# Patient Record
Sex: Female | Born: 1935 | Race: White | Hispanic: No | Smoking: Never smoker
Health system: Southern US, Community
[De-identification: ages and names within clinical notes are randomized; demographics above are authoritative.]

## PROBLEM LIST (undated history)

## (undated) DIAGNOSIS — K219 Gastro-esophageal reflux disease without esophagitis: Secondary | ICD-10-CM

## (undated) DIAGNOSIS — R002 Palpitations: Secondary | ICD-10-CM

## (undated) DIAGNOSIS — I499 Cardiac arrhythmia, unspecified: Secondary | ICD-10-CM

## (undated) DIAGNOSIS — I779 Disorder of arteries and arterioles, unspecified: Secondary | ICD-10-CM

## (undated) DIAGNOSIS — I2510 Atherosclerotic heart disease of native coronary artery without angina pectoris: Secondary | ICD-10-CM

## (undated) DIAGNOSIS — I10 Essential (primary) hypertension: Secondary | ICD-10-CM

## (undated) DIAGNOSIS — Z974 Presence of external hearing-aid: Secondary | ICD-10-CM

## (undated) DIAGNOSIS — M1990 Unspecified osteoarthritis, unspecified site: Secondary | ICD-10-CM

## (undated) DIAGNOSIS — H9190 Unspecified hearing loss, unspecified ear: Secondary | ICD-10-CM

## (undated) DIAGNOSIS — I739 Peripheral vascular disease, unspecified: Secondary | ICD-10-CM

## (undated) DIAGNOSIS — I341 Nonrheumatic mitral (valve) prolapse: Secondary | ICD-10-CM

## (undated) DIAGNOSIS — I219 Acute myocardial infarction, unspecified: Secondary | ICD-10-CM

## (undated) HISTORY — PX: DILATION AND CURETTAGE OF UTERUS: SHX78

## (undated) HISTORY — PX: CORONARY ANGIOPLASTY: SHX604

## (undated) HISTORY — PX: BREAST SURGERY: SHX581

---

## 2003-02-22 ENCOUNTER — Other Ambulatory Visit: Payer: Self-pay

## 2003-12-10 ENCOUNTER — Ambulatory Visit: Payer: Self-pay | Admitting: Unknown Physician Specialty

## 2003-12-27 ENCOUNTER — Emergency Department: Payer: Self-pay | Admitting: Emergency Medicine

## 2004-01-03 ENCOUNTER — Emergency Department: Payer: Self-pay | Admitting: Emergency Medicine

## 2004-12-12 ENCOUNTER — Ambulatory Visit: Payer: Self-pay | Admitting: Unknown Physician Specialty

## 2005-01-03 ENCOUNTER — Ambulatory Visit: Payer: Self-pay | Admitting: Gastroenterology

## 2005-11-23 ENCOUNTER — Ambulatory Visit: Payer: Self-pay | Admitting: Obstetrics and Gynecology

## 2005-12-01 ENCOUNTER — Ambulatory Visit: Payer: Self-pay | Admitting: Obstetrics and Gynecology

## 2005-12-13 ENCOUNTER — Ambulatory Visit: Payer: Self-pay | Admitting: Unknown Physician Specialty

## 2006-12-18 ENCOUNTER — Ambulatory Visit: Payer: Self-pay | Admitting: Unknown Physician Specialty

## 2007-12-20 ENCOUNTER — Ambulatory Visit: Payer: Self-pay | Admitting: Unknown Physician Specialty

## 2008-12-28 ENCOUNTER — Ambulatory Visit: Payer: Self-pay | Admitting: Unknown Physician Specialty

## 2009-06-13 ENCOUNTER — Inpatient Hospital Stay: Payer: Self-pay | Admitting: Internal Medicine

## 2009-06-14 ENCOUNTER — Ambulatory Visit: Payer: Self-pay | Admitting: Cardiovascular Disease

## 2009-06-21 ENCOUNTER — Ambulatory Visit: Payer: Self-pay | Admitting: Cardiovascular Disease

## 2009-12-30 ENCOUNTER — Ambulatory Visit: Payer: Self-pay | Admitting: Unknown Physician Specialty

## 2010-12-29 ENCOUNTER — Observation Stay: Payer: Self-pay | Admitting: Internal Medicine

## 2011-01-03 ENCOUNTER — Ambulatory Visit: Payer: Self-pay | Admitting: Unknown Physician Specialty

## 2011-04-03 ENCOUNTER — Emergency Department: Payer: Self-pay | Admitting: Emergency Medicine

## 2011-04-03 LAB — COMPREHENSIVE METABOLIC PANEL
Albumin: 3.8 g/dL (ref 3.4–5.0)
Anion Gap: 11 (ref 7–16)
BUN: 19 mg/dL — ABNORMAL HIGH (ref 7–18)
Bilirubin,Total: 0.6 mg/dL (ref 0.2–1.0)
Calcium, Total: 9.3 mg/dL (ref 8.5–10.1)
Chloride: 109 mmol/L — ABNORMAL HIGH (ref 98–107)
Co2: 24 mmol/L (ref 21–32)
Creatinine: 0.82 mg/dL (ref 0.60–1.30)
EGFR (African American): >60
EGFR (Non-African Amer.): >60
Osmolality: 289 (ref 275–301)
Potassium: 4 mmol/L (ref 3.5–5.1)
SGOT(AST): 32 U/L (ref 15–37)
Sodium: 144 mmol/L (ref 136–145)

## 2011-04-03 LAB — CBC WITH DIFFERENTIAL/PLATELET
Basophil #: 0.1 10*3/uL (ref 0.0–0.1)
HCT: 42.5 % (ref 35.0–47.0)
Lymphocyte #: 2 10*3/uL (ref 1.0–3.6)
Lymphocyte %: 23.4 %
MCHC: 31.8 g/dL — ABNORMAL LOW (ref 32.0–36.0)
Monocyte #: 0.9 10*3/uL — ABNORMAL HIGH (ref 0.0–0.7)
Monocyte %: 10.8 %
Neutrophil %: 63 %
Platelet: 180 10*3/uL (ref 150–440)
RDW: 13.3 % (ref 11.5–14.5)
WBC: 8.7 10*3/uL (ref 3.6–11.0)

## 2011-04-03 LAB — TROPONIN I: Troponin-I: <0.02 ng/mL

## 2011-04-03 LAB — PROTIME-INR: Prothrombin Time: 22 secs — ABNORMAL HIGH (ref 11.5–14.7)

## 2012-01-04 ENCOUNTER — Ambulatory Visit: Payer: Self-pay | Admitting: Internal Medicine

## 2013-01-07 ENCOUNTER — Ambulatory Visit: Payer: Self-pay | Admitting: Internal Medicine

## 2013-09-22 DIAGNOSIS — Z955 Presence of coronary angioplasty implant and graft: Secondary | ICD-10-CM

## 2015-07-06 DIAGNOSIS — I482 Chronic atrial fibrillation, unspecified: Secondary | ICD-10-CM | POA: Diagnosis present

## 2015-07-06 DIAGNOSIS — I10 Essential (primary) hypertension: Secondary | ICD-10-CM | POA: Diagnosis present

## 2016-01-12 ENCOUNTER — Encounter: Payer: Self-pay | Admitting: *Deleted

## 2016-01-18 ENCOUNTER — Encounter: Payer: Self-pay | Admitting: *Deleted

## 2016-01-18 ENCOUNTER — Ambulatory Visit: Payer: Medicare Other | Admitting: Anesthesiology

## 2016-01-18 ENCOUNTER — Encounter
Admission: RE | Disposition: A | Discharge status: Home / Self Care | Payer: Self-pay | Source: Ambulatory Visit | Attending: Ophthalmology

## 2016-01-18 ENCOUNTER — Ambulatory Visit
Admission: RE | Admit: 2016-01-18 | Discharge: 2016-01-18 | Disposition: A | Discharge status: Home / Self Care | Payer: Medicare Other | Source: Ambulatory Visit | Attending: Ophthalmology | Admitting: Ophthalmology

## 2016-01-18 DIAGNOSIS — I252 Old myocardial infarction: Secondary | ICD-10-CM | POA: Diagnosis not present

## 2016-01-18 DIAGNOSIS — Z791 Long term (current) use of non-steroidal anti-inflammatories (NSAID): Secondary | ICD-10-CM | POA: Insufficient documentation

## 2016-01-18 DIAGNOSIS — Z7901 Long term (current) use of anticoagulants: Secondary | ICD-10-CM | POA: Diagnosis not present

## 2016-01-18 DIAGNOSIS — H2512 Age-related nuclear cataract, left eye: Secondary | ICD-10-CM | POA: Diagnosis not present

## 2016-01-18 DIAGNOSIS — I2510 Atherosclerotic heart disease of native coronary artery without angina pectoris: Secondary | ICD-10-CM | POA: Diagnosis not present

## 2016-01-18 DIAGNOSIS — Z7982 Long term (current) use of aspirin: Secondary | ICD-10-CM | POA: Insufficient documentation

## 2016-01-18 DIAGNOSIS — I4891 Unspecified atrial fibrillation: Secondary | ICD-10-CM | POA: Diagnosis not present

## 2016-01-18 DIAGNOSIS — Z79899 Other long term (current) drug therapy: Secondary | ICD-10-CM | POA: Diagnosis not present

## 2016-01-18 DIAGNOSIS — I10 Essential (primary) hypertension: Secondary | ICD-10-CM | POA: Insufficient documentation

## 2016-01-18 DIAGNOSIS — Z955 Presence of coronary angioplasty implant and graft: Secondary | ICD-10-CM | POA: Diagnosis not present

## 2016-01-18 HISTORY — DX: Palpitations: R00.2

## 2016-01-18 HISTORY — DX: Cardiac arrhythmia, unspecified: I49.9

## 2016-01-18 HISTORY — DX: Peripheral vascular disease, unspecified: I73.9

## 2016-01-18 HISTORY — DX: Nonrheumatic mitral (valve) prolapse: I34.1

## 2016-01-18 HISTORY — DX: Atherosclerotic heart disease of native coronary artery without angina pectoris: I25.10

## 2016-01-18 HISTORY — DX: Essential (primary) hypertension: I10

## 2016-01-18 HISTORY — PX: CATARACT EXTRACTION W/PHACO: SHX586

## 2016-01-18 HISTORY — DX: Acute myocardial infarction, unspecified: I21.9

## 2016-01-18 HISTORY — DX: Unspecified hearing loss, unspecified ear: H91.90

## 2016-01-18 HISTORY — DX: Disorder of arteries and arterioles, unspecified: I77.9

## 2016-01-18 HISTORY — DX: Unspecified osteoarthritis, unspecified site: M19.90

## 2016-01-18 SURGERY — PHACOEMULSIFICATION, CATARACT, WITH IOL INSERTION
Anesthesia: Monitor Anesthesia Care | Laterality: Left | Wound class: Clean

## 2016-01-18 MED ORDER — EPINEPHRINE PF 1 MG/ML IJ SOLN
INTRAMUSCULAR | Status: AC
  Filled 2016-01-18: qty 2

## 2016-01-18 MED ORDER — POVIDONE-IODINE 5 % OP SOLN
OPHTHALMIC | Status: AC
  Filled 2016-01-18: qty 30

## 2016-01-18 MED ORDER — LIDOCAINE HCL (PF) 4 % IJ SOLN
INTRAMUSCULAR | Status: AC
  Filled 2016-01-18: qty 5

## 2016-01-18 MED ORDER — OXYCODONE HCL 5 MG PO TABS: 5.0000 mg | ORAL_TABLET | Freq: Once | ORAL | Status: DC | PRN

## 2016-01-18 MED ORDER — OXYCODONE HCL 5 MG/5ML PO SOLN: 5.0000 mg | Freq: Once | ORAL | Status: DC | PRN

## 2016-01-18 MED ORDER — EPINEPHRINE PF 1 MG/ML IJ SOLN
INTRAOCULAR | Status: DC | PRN
  Administered 2016-01-18: 250 mL via OPHTHALMIC

## 2016-01-18 MED ORDER — LIDOCAINE HCL (PF) 4 % IJ SOLN
INTRAOCULAR | Status: DC | PRN
  Administered 2016-01-18: 1 mL via OPHTHALMIC

## 2016-01-18 MED ORDER — CARBACHOL 0.01 % IO SOLN
INTRAOCULAR | Status: DC | PRN
  Administered 2016-01-18: 0.5 mL via INTRAOCULAR

## 2016-01-18 MED ORDER — MIDAZOLAM HCL 2 MG/2ML IJ SOLN
INTRAMUSCULAR | Status: DC | PRN
  Administered 2016-01-18: 1 mg via INTRAVENOUS

## 2016-01-18 MED ORDER — FENTANYL CITRATE (PF) 100 MCG/2ML IJ SOLN: 25.0000 ug | INTRAMUSCULAR | Status: DC | PRN

## 2016-01-18 MED ORDER — ARMC OPHTHALMIC DILATING DROPS
1.0000 "application " | OPHTHALMIC | Status: AC
  Administered 2016-01-18 (×3): 1 via OPHTHALMIC

## 2016-01-18 MED ORDER — MOXIFLOXACIN HCL 0.5 % OP SOLN
OPHTHALMIC | Status: DC | PRN
  Administered 2016-01-18: 1 [drp] via OPHTHALMIC

## 2016-01-18 MED ORDER — SODIUM CHLORIDE 0.9 % IV SOLN
INTRAVENOUS | Status: DC
  Administered 2016-01-18: 07:00:00 via INTRAVENOUS

## 2016-01-18 MED ORDER — ARMC OPHTHALMIC DILATING DROPS: 1.0000 "application " | OPHTHALMIC | Status: AC

## 2016-01-18 MED ORDER — MOXIFLOXACIN HCL 0.5 % OP SOLN: 1.0000 [drp] | OPHTHALMIC | Status: AC

## 2016-01-18 MED ORDER — MOXIFLOXACIN HCL 0.5 % OP SOLN
1.0000 [drp] | OPHTHALMIC | Status: AC
  Administered 2016-01-18 (×3): 1 [drp] via OPHTHALMIC

## 2016-01-18 MED ORDER — NA CHONDROIT SULF-NA HYALURON 40-17 MG/ML IO SOLN
INTRAOCULAR | Status: DC | PRN
  Administered 2016-01-18: 1 mL via INTRAOCULAR

## 2016-01-18 SURGICAL SUPPLY — 21 items
CANNULA ANT/CHMB 27GA (MISCELLANEOUS) ×2 IMPLANT
CUP MEDICINE 2OZ PLAST GRAD ST (MISCELLANEOUS) ×2 IMPLANT
GLOVE BIO SURGEON STRL SZ8 (GLOVE) ×2 IMPLANT
GLOVE BIOGEL M 6.5 STRL (GLOVE) ×2 IMPLANT
GLOVE SURG LX 8.0 MICRO (GLOVE) ×1
GLOVE SURG LX STRL 8.0 MICRO (GLOVE) ×1 IMPLANT
GOWN STRL REUS W/ TWL LRG LVL3 (GOWN DISPOSABLE) ×2 IMPLANT
GOWN STRL REUS W/TWL LRG LVL3 (GOWN DISPOSABLE) ×2
LENS IOL TECNIS ITEC 22.5 (Intraocular Lens) ×2 IMPLANT
PACK CATARACT (MISCELLANEOUS) ×2 IMPLANT
PACK CATARACT BRASINGTON LX (MISCELLANEOUS) ×2 IMPLANT
PACK EYE AFTER SURG (MISCELLANEOUS) ×2 IMPLANT
SOL BSS BAG (MISCELLANEOUS) ×2
SOL PREP PVP 2OZ (MISCELLANEOUS) ×2
SOLUTION BSS BAG (MISCELLANEOUS) ×1 IMPLANT
SOLUTION PREP PVP 2OZ (MISCELLANEOUS) ×1 IMPLANT
SYR 3ML LL SCALE MARK (SYRINGE) ×2 IMPLANT
SYR 5ML LL (SYRINGE) ×2 IMPLANT
SYR TB 1ML 27GX1/2 LL (SYRINGE) ×2 IMPLANT
WATER STERILE IRR 250ML POUR (IV SOLUTION) ×2 IMPLANT
WIPE NON LINTING 3.25X3.25 (MISCELLANEOUS) ×2 IMPLANT

## 2016-01-18 NOTE — Anesthesia Postprocedure Evaluation (Signed)
Anesthesia Post Note  Patient: Rhea PinkJohnnie B Caperton  Procedure(s) Performed: Procedure(s) (LRB): CATARACT EXTRACTION PHACO AND INTRAOCULAR LENS PLACEMENT (IOC) (Left)  Patient location during evaluation: PACU Anesthesia Type: MAC Level of consciousness: awake and alert Pain management: pain level controlled Vital Signs Assessment: post-procedure vital signs reviewed and stable Respiratory status: spontaneous breathing, nonlabored ventilation, respiratory function stable and patient connected to nasal cannula oxygen Cardiovascular status: stable and blood pressure returned to baseline Anesthetic complications: no     Last Vitals:  Vitals:   01/18/16 0816 01/18/16 0819  BP: (!) 141/54 (!) 141/54  Pulse: (!) 59 (!) 59  Resp: 18 18  Temp: 36.8 C 36.8 C    Last Pain:  Vitals:   01/18/16 0639  TempSrc: Oral                 Jules SchickLogan,  Jenaye Rickert P

## 2016-01-18 NOTE — Transfer of Care (Signed)
Immediate Anesthesia Transfer of Care Note  Patient: Rhea PinkJohnnie B Schiele  Procedure(s) Performed: Procedure(s) with comments: CATARACT EXTRACTION PHACO AND INTRAOCULAR LENS PLACEMENT (IOC) (Left) - PACK [id number] H US:01:40 AP:59.4 CDE:26.44  Patient Location: Short Stay  Anesthesia Type:MAC  Level of Consciousness: awake and alert   Airway & Oxygen Therapy: Patient Spontanous Breathing  Post-op Assessment: Report given to RN and Post -op Vital signs reviewed and stable  Post vital signs: Reviewed  Last Vitals:  Vitals:   01/18/16 0816 01/18/16 0819  BP: (!) 141/54 (!) 141/54  Pulse: (!) 59 (!) 59  Resp: 18 18  Temp: 36.8 C 36.8 C    Last Pain:  Vitals:   01/18/16 0639  TempSrc: Oral         Complications: No apparent anesthesia complications

## 2016-01-18 NOTE — Op Note (Signed)
PREOPERATIVE DIAGNOSIS:  Nuclear sclerotic cataract of the left eye.   POSTOPERATIVE DIAGNOSIS:  Nuclear sclerotic cataract of the left eye.   OPERATIVE PROCEDURE: Procedure(s): CATARACT EXTRACTION PHACO AND INTRAOCULAR LENS PLACEMENT (IOC)   SURGEON:  Galen ManilaWilliam Rotha Cassels, MD.   ANESTHESIA:  Anesthesiologist: Rosaria FerriesJoseph K Piscitello, MD CRNA: Mathews ArgyleBenjamin Logan, CRNA  1.      Managed anesthesia care. 2.     0.581ml of Shugarcaine was instilled following the paracentesis   COMPLICATIONS:  None.   TECHNIQUE:   Stop and chop   DESCRIPTION OF PROCEDURE:  The patient was examined and consented in the preoperative holding area where the aforementioned topical anesthesia was applied to the left eye and then brought back to the Operating Room where the left eye was prepped and draped in the usual sterile ophthalmic fashion and a lid speculum was placed. A paracentesis was created with the side port blade and the anterior chamber was filled with viscoelastic. A near clear corneal incision was performed with the steel keratome. A continuous curvilinear capsulorrhexis was performed with a cystotome followed by the capsulorrhexis forceps. Hydrodissection and hydrodelineation were carried out with BSS on a blunt cannula. The lens was removed in a stop and chop  technique and the remaining cortical material was removed with the irrigation-aspiration handpiece. The capsular bag was inflated with viscoelastic and the Technis [id number] lens was placed in the capsular bag without complication. The remaining viscoelastic was removed from the eye with the irrigation-aspiration handpiece. The wounds were hydrated. The anterior chamber was flushed with Miostat and the eye was inflated to physiologic pressure. 0.681ml Vigamox was placed in the anterior chamber. The wounds were found to be water tight. The eye was dressed with Vigamox. The patient was given protective glasses to wear throughout the day and a shield with which to sleep  tonight. The patient was also given drops with which to begin a drop regimen today and will follow-up with me in one day.  Implant Name Type Inv. Item Serial No. Manufacturer Lot No. LRB No. Used  LENS IOL DIOP 22.5 - [id number] Intraocular Lens LENS IOL DIOP 22.5 [id number] AMO   Left 1    Procedure(s) with comments: CATARACT EXTRACTION PHACO AND INTRAOCULAR LENS PLACEMENT (IOC) (Left) - PACK [id number] H US:01:40 AP:59.4 CDE:26.44  Electronically signed: Pinchus Weckwerth LOUIS 01/18/2016 8:17 AM

## 2016-01-18 NOTE — Discharge Instructions (Signed)
Eye Surgery Discharge Instructions  Expect mild scratchy sensation or mild soreness. DO NOT RUB YOUR EYE!  The day of surgery:  Minimal physical activity, but bed rest is not required  No reading, computer work, or close hand work  No bending, lifting, or straining.  May watch TV  For 24 hours:  No driving, legal decisions, or alcoholic beverages  Safety precautions  Eat anything you prefer: It is better to start with liquids, then soup then solid foods.  _____ Eye patch should be worn until postoperative exam tomorrow.  ____ Solar shield eyeglasses should be worn for comfort in the sunlight/patch while sleeping  Resume all regular medications including aspirin or Coumadin if these were discontinued prior to surgery. You may shower, bathe, shave, or wash your hair. Tylenol may be taken for mild discomfort.  Call your doctor if you experience significant pain, nausea, or vomiting, fever > 101 or other signs of infection. [phone] or [phone] Specific instructions:  Follow-up Information    PORFILIO,WILLIAM LOUIS, MD Follow up.   Specialty:  Ophthalmology Why:  December 20 at 9:45am Contact information: [address] [place] [address] [phone]

## 2016-01-18 NOTE — Anesthesia Preprocedure Evaluation (Signed)
Anesthesia Evaluation  Patient identified by MRN, date of birth, ID band Patient awake    Reviewed: Allergy & Precautions, H&P , NPO status , Patient's Chart, lab work & pertinent test results  Airway Mallampati: III  TM Distance: <3 FB Neck ROM: limited    Dental  (+) Poor Dentition, Chipped   Pulmonary neg pulmonary ROS, neg shortness of breath,    Pulmonary exam normal breath sounds clear to auscultation       Cardiovascular Exercise Tolerance: Good hypertension, (-) angina+ CAD, + Past MI and + Peripheral Vascular Disease  Normal cardiovascular exam+ dysrhythmias Atrial Fibrillation  Rhythm:regular Rate:Normal     Neuro/Psych negative neurological ROS  negative psych ROS   GI/Hepatic negative GI ROS, Neg liver ROS,   Endo/Other  negative endocrine ROS  Renal/GU      Musculoskeletal  (+) Arthritis ,   Abdominal   Peds  Hematology negative hematology ROS (+)   Anesthesia Other Findings Past Medical History: No date: Arthritis No date: Carotid artery disease (HCC)     Comment: BLOCKAGE No date: Coronary artery disease No date: Dysrhythmia     Comment: AFIB No date: HOH (hard of hearing) No date: Hypertension No date: MVP (mitral valve prolapse) No date: Myocardial infarction     Comment: MILD No date: Palpitations  Past Surgical History: No date: BREAST SURGERY Left     Comment: BIOPSY No date: CORONARY ANGIOPLASTY     Comment: STENT No date: DILATION AND CURETTAGE OF UTERUS  BMI    Body Mass Index:  22.96 kg/m      Reproductive/Obstetrics negative OB ROS                             Anesthesia Physical Anesthesia Plan  ASA: III  Anesthesia Plan: MAC   Post-op Pain Management:    Induction:   Airway Management Planned:   Additional Equipment:   Intra-op Plan:   Post-operative Plan:   Informed Consent: I have reviewed the patients History and Physical,  chart, labs and discussed the procedure including the risks, benefits and alternatives for the proposed anesthesia with the patient or authorized representative who has indicated his/her understanding and acceptance.     Plan Discussed with: Anesthesiologist, CRNA and Surgeon  Anesthesia Plan Comments:         Anesthesia Quick Evaluation

## 2016-01-18 NOTE — H&P (Signed)
All labs reviewed. Abnormal studies sent to patients PCP when indicated.  Previous H&P reviewed, patient examined, there are NO CHANGES.  Cynthia Simon LOUIS12/19/20177:50 AM

## 2017-11-29 ENCOUNTER — Emergency Department: Payer: Medicare Other

## 2017-11-29 ENCOUNTER — Emergency Department
Admission: EM | Admit: 2017-11-29 | Discharge: 2017-11-29 | Disposition: A | Discharge status: Home / Self Care | Payer: Medicare Other | Attending: Student in an Organized Health Care Education/Training Program | Admitting: Student in an Organized Health Care Education/Training Program

## 2017-11-29 DIAGNOSIS — Z955 Presence of coronary angioplasty implant and graft: Secondary | ICD-10-CM | POA: Insufficient documentation

## 2017-11-29 DIAGNOSIS — I252 Old myocardial infarction: Secondary | ICD-10-CM | POA: Diagnosis not present

## 2017-11-29 DIAGNOSIS — R079 Chest pain, unspecified: Secondary | ICD-10-CM | POA: Diagnosis not present

## 2017-11-29 DIAGNOSIS — Z7901 Long term (current) use of anticoagulants: Secondary | ICD-10-CM | POA: Insufficient documentation

## 2017-11-29 DIAGNOSIS — I10 Essential (primary) hypertension: Secondary | ICD-10-CM | POA: Insufficient documentation

## 2017-11-29 DIAGNOSIS — Z7982 Long term (current) use of aspirin: Secondary | ICD-10-CM | POA: Insufficient documentation

## 2017-11-29 DIAGNOSIS — I2510 Atherosclerotic heart disease of native coronary artery without angina pectoris: Secondary | ICD-10-CM | POA: Insufficient documentation

## 2017-11-29 LAB — CBC
HEMATOCRIT: 39.9 % (ref 36.0–46.0)
Hemoglobin: 13 g/dL (ref 12.0–15.0)
MCH: 31.5 pg (ref 26.0–34.0)
MCHC: 32.6 g/dL (ref 30.0–36.0)
MCV: 96.6 fL (ref 80.0–100.0)
NRBC: 0 % (ref 0.0–0.2)
Platelets: 246 10*3/uL (ref 150–400)
RBC: 4.13 MIL/uL (ref 3.87–5.11)
RDW: 12.9 % (ref 11.5–15.5)
WBC: 9.4 10*3/uL (ref 4.0–10.5)

## 2017-11-29 LAB — BASIC METABOLIC PANEL
Anion gap: 11 (ref 5–15)
BUN: 15 mg/dL (ref 8–23)
CHLORIDE: 96 mmol/L — AB (ref 98–111)
CO2: 30 mmol/L (ref 22–32)
CREATININE: 0.74 mg/dL (ref 0.44–1.00)
Calcium: 9.6 mg/dL (ref 8.9–10.3)
GFR calc Af Amer: >60 mL/min (ref >60)
GFR calc non Af Amer: >60 mL/min (ref >60)
GLUCOSE: 105 mg/dL — AB (ref 70–99)
Potassium: 3.3 mmol/L — ABNORMAL LOW (ref 3.5–5.1)
Sodium: 137 mmol/L (ref 135–145)

## 2017-11-29 LAB — PROTIME-INR
INR: 1.95
PROTHROMBIN TIME: 22 s — AB (ref 11.4–15.2)

## 2017-11-29 LAB — TROPONIN I: Troponin I: <0.03 ng/mL (ref <0.03)

## 2017-11-29 LAB — HEPATIC FUNCTION PANEL
ALBUMIN: 3.7 g/dL (ref 3.5–5.0)
ALK PHOS: 44 U/L (ref 38–126)
ALT: 16 U/L (ref 0–44)
AST: 18 U/L (ref 15–41)
BILIRUBIN TOTAL: 0.7 mg/dL (ref 0.3–1.2)
Bilirubin, Direct: <0.1 mg/dL (ref 0.0–0.2)
Total Protein: 6.7 g/dL (ref 6.5–8.1)

## 2017-11-29 LAB — LIPASE, BLOOD: Lipase: 27 U/L (ref 11–51)

## 2017-11-29 LAB — APTT: aPTT: 41 seconds — ABNORMAL HIGH (ref 24–36)

## 2017-11-29 MED ORDER — HYDROCHLOROTHIAZIDE 25 MG PO TABS
25.0000 mg | ORAL_TABLET | Freq: Every day | ORAL | Status: DC
  Administered 2017-11-29: 25 mg via ORAL
  Filled 2017-11-29: qty 1

## 2017-11-29 MED ORDER — PANTOPRAZOLE SODIUM 40 MG PO TBEC
40.0000 mg | DELAYED_RELEASE_TABLET | Freq: Once | ORAL | Status: AC
  Administered 2017-11-29: 40 mg via ORAL
  Filled 2017-11-29: qty 1

## 2017-11-29 MED ORDER — ISOSORBIDE MONONITRATE ER 60 MG PO TB24: 30.0000 mg | ORAL_TABLET | Freq: Every day | ORAL | Status: DC

## 2017-11-29 MED ORDER — ISOSORBIDE MONONITRATE ER 60 MG PO TB24
30.0000 mg | ORAL_TABLET | Freq: Every day | ORAL | Status: DC
  Administered 2017-11-29: 30 mg via ORAL
  Filled 2017-11-29: qty 1

## 2017-11-29 MED ORDER — METOPROLOL TARTRATE 25 MG PO TABS
12.5000 mg | ORAL_TABLET | Freq: Once | ORAL | Status: AC
  Administered 2017-11-29: 12.5 mg via ORAL
  Filled 2017-11-29: qty 1

## 2017-11-29 MED ORDER — SIMETHICONE 40 MG/0.6ML PO SUSP (UNIT DOSE)
40.0000 mg | Freq: Once | ORAL | Status: AC
  Administered 2017-11-29: 40 mg via ORAL
  Filled 2017-11-29: qty 0.6

## 2017-11-29 NOTE — ED Notes (Signed)
Introduced self to pt. Pt denies pain currently. Resting comfortably. Family at bedside.

## 2017-11-29 NOTE — ED Notes (Signed)
Pt uprite on stretcher in exam room with no distress noted; reports last 1-2wks having "indigestion"; after going to [facility] at 430am began noticing "dull chest discomfort" that subsided after walking around; pt denies pain currently; card monitor in place; family at bedside; resp even/unlab, lungs clear, apical audible & irreg; +BS, abd soft/nondist/nontender

## 2017-11-29 NOTE — ED Triage Notes (Signed)
Patient c/o left chest pain radiating down left arm. Patient reports accompanying symptom of nausea. Patient reports hx of afib, stent placement, and hypertension.

## 2017-11-29 NOTE — ED Provider Notes (Signed)
[facility] Emergency Department Provider Note    First MD Initiated Contact with Patient 11/29/17 [phone]     (approximate)  I have reviewed the triage vital signs and the nursing notes.   HISTORY  Chief Complaint Chest Pain    HPI Cynthia Simon is a 84 y.o. female   with a history of CAD followed by Dr. Beola Cord shows cardiology who presents the ER with intermittent episodes of midsternal chest pain and pressure for the past week.  Patient had one episode this morning around 4:30 AM where she was getting up to go the bathroom and felt midsternal pressure.  Did not have any diaphoresis.  No nausea or vomiting.  Has felt gassy like she needed to belch.  There is no change in pain with movement.  She tried nitroglycerin which she has at home, but did not get any relief from nitro.  States that the pain subsided on its own.  Presented to the ER because she felt like it was similar to some of the discomfort that she was having in 2011 when she subsequently had stent placed after cardiac cath.  Did recently see cardiology clinic and had some medication changes but is been compliant with her medications.  She is pain-free at this time.  No shortness of breath, leg swelling, pleuritic discomfort, fevers or cough.   Past Medical History:  Diagnosis Date  . Arthritis   . Carotid artery disease (HCC)    BLOCKAGE  . Coronary artery disease   . Dysrhythmia    AFIB  . HOH (hard of hearing)   . Hypertension   . MVP (mitral valve prolapse)   . Myocardial infarction (HCC)    MILD  . Palpitations    No family history on file. Past Surgical History:  Procedure Laterality Date  . BREAST SURGERY Left    BIOPSY  . CATARACT EXTRACTION W/PHACO Left 01/18/2016   Procedure: CATARACT EXTRACTION PHACO AND INTRAOCULAR LENS PLACEMENT (IOC);  Surgeon: Galen Manila, MD;  Location: [facility] ORS;  Service: Ophthalmology;  Laterality: Left;  PACK  [id number]:[id number] H US:01:40 AP:59.4 CDE:26.44  . CORONARY ANGIOPLASTY     STENT  . DILATION AND CURETTAGE OF UTERUS     There are no active problems to display for this patient.     Prior to Admission medications   Medication Sig Start Date End Date Taking? Authorizing Provider  Aflibercept 2 MG/0.05ML SOLN Apply 0.05 mLs to eye every 30 (thirty) days.   Yes [provider]  aspirin EC 81 MG tablet Take 81 mg by mouth daily.   Yes [provider]  hydrochlorothiazide (HYDRODIURIL) 25 MG tablet Take 25 mg by mouth daily.   Yes [provider]  ibuprofen (ADVIL,MOTRIN) 200 MG tablet Take 200 mg by mouth at bedtime.   Yes [provider]  isosorbide mononitrate (IMDUR) 30 MG 24 hr tablet Take 30 mg by mouth daily.   Yes [provider]  loratadine (CLARITIN) 10 MG tablet Take 10 mg by mouth daily as needed for allergies.    Yes [provider]  losartan (COZAAR) 50 MG tablet Take 50 mg by mouth daily.   Yes [provider]  metoprolol tartrate (LOPRESSOR) 25 MG tablet Take 12.5 mg by mouth 2 (two) times daily.    Yes [provider]  nitroGLYCERIN (NITROSTAT) 0.4 MG SL tablet Place 0.4 mg under the tongue every 5 (five) minutes as needed for chest pain.   Yes [provider]  pantoprazole (PROTONIX) 40 MG tablet Take 40 mg by mouth daily.   Yes [provider]  Polyethyl Glycol-Propyl Glycol (SYSTANE OP) Place 1 drop into both eyes daily as needed (dry eyes).   Yes [provider]  ranitidine (ZANTAC) 150 MG capsule Take 150 mg by mouth at bedtime.    Yes [provider]  simvastatin (ZOCOR) 10 MG tablet Take 10 mg by mouth at bedtime.    Yes [provider]  traMADol (ULTRAM) 50 MG tablet Take 50 mg by mouth daily.    Yes [provider]  warfarin (COUMADIN) 5 MG tablet Take 2.5-5 mg by mouth See admin instructions. Warfarin 5 mg daily at bedtime alternating with warfarin  2.5 mg daily at bedtime   Yes [provider]    Allergies Amlodipine besylate    Social History Social History   Tobacco Use  . Smoking status: Never Smoker  . Smokeless tobacco: Never Used  Substance Use Topics  . Alcohol use: No  . Drug use: No    Review of Systems Patient denies headaches, rhinorrhea, blurry vision, numbness, shortness of breath, chest pain, edema, cough, abdominal pain, nausea, vomiting, diarrhea, dysuria, fevers, rashes or hallucinations unless otherwise stated above in HPI. ____________________________________________   PHYSICAL EXAM:  VITAL SIGNS: Vitals:   11/29/17 0641 11/29/17 1021  BP: (!) 174/69 (!) 152/64  Pulse: (!) 59 (!) 57  Resp: 18 16  Temp: 97.6 F (36.4 C) 98 F (36.7 C)  SpO2: 97% 98%    Constitutional: Alert and oriented.  Eyes: Conjunctivae are normal.  Head: Atraumatic. Nose: No congestion/rhinnorhea. Mouth/Throat: Mucous membranes are moist.   Neck: No stridor. Painless ROM.  Cardiovascular: Normal rate, regular rhythm. Grossly normal heart sounds.  Good peripheral circulation. Respiratory: Normal respiratory effort.  No retractions. Lungs CTAB. Gastrointestinal: Soft and nontender. No distention. No abdominal bruits. No CVA tenderness. Genitourinary:  Musculoskeletal: No lower extremity tenderness trace pedal edema.  No joint effusions. Neurologic:  Normal speech and language. No gross focal neurologic deficits are appreciated. No facial droop Skin:  Skin is warm, dry and intact. No rash noted. Psychiatric: Mood and affect are normal. Speech and behavior are normal.  ____________________________________________   LABS (all labs ordered are listed, but only abnormal results are displayed)  Results for orders placed or performed during the hospital encounter of 11/29/17 (from the past 24 hour(s))  Basic metabolic panel     Status: Abnormal   Collection Time: 11/29/17  6:40 AM  Result Value Ref Range    Sodium 137 135 - 145 mmol/L   Potassium 3.3 (L) 3.5 - 5.1 mmol/L   Chloride 96 (L) 98 - 111 mmol/L   CO2 30 22 - 32 mmol/L   Glucose, Bld 105 (H) 70 - 99 mg/dL   BUN 15 8 - 23 mg/dL   Creatinine, Ser 8.11 0.44 - 1.00 mg/dL   Calcium 9.6 8.9 - 91.4 mg/dL   GFR calc non Af Amer >60 >60 mL/min   GFR calc Af Amer >60 >60 mL/min   Anion gap 11 5 - 15  CBC     Status: None   Collection Time: 11/29/17  6:40 AM  Result Value Ref Range   WBC 9.4 4.0 - 10.5 K/uL   RBC 4.13 3.87 - 5.11 MIL/uL   Hemoglobin 13.0 12.0 - 15.0 g/dL   HCT 78.2 95.6 - 21.3 %   MCV 96.6 80.0 - 100.0 fL   MCH 31.5 26.0 - 34.0 pg  MCHC 32.6 30.0 - 36.0 g/dL   RDW 40.9 81.1 - 91.4 %   Platelets 246 150 - 400 K/uL   nRBC 0.0 0.0 - 0.2 %  Troponin I     Status: None   Collection Time: 11/29/17  6:40 AM  Result Value Ref Range   Troponin I <0.03 <0.03 ng/mL  Hepatic function panel     Status: None   Collection Time: 11/29/17  7:12 AM  Result Value Ref Range   Total Protein 6.7 6.5 - 8.1 g/dL   Albumin 3.7 3.5 - 5.0 g/dL   AST 18 15 - 41 U/L   ALT 16 0 - 44 U/L   Alkaline Phosphatase 44 38 - 126 U/L   Total Bilirubin 0.7 0.3 - 1.2 mg/dL   Bilirubin, Direct <7.8 0.0 - 0.2 mg/dL   Indirect Bilirubin NOT CALCULATED 0.3 - 0.9 mg/dL  Protime-INR     Status: Abnormal   Collection Time: 11/29/17  7:12 AM  Result Value Ref Range   Prothrombin Time 22.0 (H) 11.4 - 15.2 seconds   INR 1.95   APTT     Status: Abnormal   Collection Time: 11/29/17  7:12 AM  Result Value Ref Range   aPTT 41 (H) 24 - 36 seconds  Lipase, blood     Status: None   Collection Time: 11/29/17  7:12 AM  Result Value Ref Range   Lipase 27 11 - 51 U/L  Troponin I     Status: None   Collection Time: 11/29/17  9:50 AM  Result Value Ref Range   Troponin I <0.03 <0.03 ng/mL   ____________________________________________  EKG My review and personal interpretation at Time: 6:44   Indication: chest pain  Rate: 70  Rhythm: afib Axis:  normal Other: occasional pvc, poor r wave progre ____________________________________________  RADIOLOGY  I personally reviewed all radiographic images ordered to evaluate for the above acute complaints and reviewed radiology reports and findings.  These findings were personally discussed with the patient.  Please see medical record for radiology report.  ____________________________________________   PROCEDURES  Procedure(s) performed:  Procedures    Critical Care performed: no ____________________________________________   INITIAL IMPRESSION / ASSESSMENT AND PLAN / ED COURSE  Pertinent labs & imaging results that were available during my care of the patient were reviewed by me and considered in my medical decision making (see chart for details).   DDX: angina, acs, pna, copd, msk strain, gerd, dysrhythmia  Dante B Klosinski is a 83 y.o. who presents to the ED with symptoms as described above.  She is afebrile Heema dynamically stable.  She is pain-free at this time.  EKG does show chronic A. fib patient is properly anticoagulated on Coumadin.  Not clinically consistent with PE or dissection.  Will send enzymes to evaluate for ACS.  Certainly atypical symptoms with patient does have significant risk factors and known CAD.  The patient will be placed on continuous pulse oximetry and telemetry for monitoring.  Laboratory evaluation will be sent to evaluate for the above complaints.     Clinical Course as of Nov 29 1124  Thu Nov 29, 2017  0801 Patient's initial troponin negative.  Remains pain-free.  We will continue to observe.  Will repeat troponin as well as consult cardiology.   [PR]  [phone] Patient states that she was having recurrent pain got up to move her bowels and had passed a large amount of gas with resolution of her pain.  She is pain-free now.  Discussed results of initial troponin.  Patient has not taken her home medications this morning therefore will order those now  while we are awaiting repeat troponin.   [PR]  1123 I discussed the case with Dr. Williemae Area of cardiology.  Very familiar with the patient.  She is remained pain-free since arrival to the ER.  With 2- enzymes and EKG there is consistent with previous.  Did offer observation in the hospital by Dr. Danielle Dess is also feels comfortable seeing patient in clinic in an expedited fashion which I think is reasonable.  I discussed signs and symptoms for which the patient should return immediately to the Er.  Have discussed with the patient and available family all diagnostics and treatments performed thus far and all questions were answered to the best of my ability. The patient demonstrates understanding and agreement with plan.     [PR]    Clinical Course User Index [PR] Willy Eddy, MD     As part of my medical decision making, I reviewed the following data within the electronic MEDICAL RECORD NUMBER Nursing notes reviewed and incorporated, Labs reviewed, notes from prior ED visits and [place] Controlled Substance Database   ____________________________________________   FINAL CLINICAL IMPRESSION(S) / ED DIAGNOSES  Final diagnoses:  Chest pain, unspecified type      NEW MEDICATIONS STARTED DURING THIS VISIT:  New Prescriptions   No medications on file     Note:  This document was prepared using Dragon voice recognition software and may include unintentional dictation errors.    Willy Eddy, MD 11/29/17 [phone]

## 2018-12-07 ENCOUNTER — Emergency Department: Payer: Medicare Other

## 2018-12-07 ENCOUNTER — Inpatient Hospital Stay
Admission: EM | Admit: 2018-12-07 | Discharge: 2018-12-12 | DRG: 872 | Disposition: A | Discharge status: Home Health | Payer: Medicare Other | Attending: Internal Medicine | Admitting: Internal Medicine

## 2018-12-07 ENCOUNTER — Other Ambulatory Visit: Payer: Self-pay

## 2018-12-07 ENCOUNTER — Encounter: Payer: Self-pay | Admitting: Emergency Medicine

## 2018-12-07 DIAGNOSIS — H9190 Unspecified hearing loss, unspecified ear: Secondary | ICD-10-CM | POA: Diagnosis present

## 2018-12-07 DIAGNOSIS — K219 Gastro-esophageal reflux disease without esophagitis: Secondary | ICD-10-CM | POA: Diagnosis present

## 2018-12-07 DIAGNOSIS — T3695XA Adverse effect of unspecified systemic antibiotic, initial encounter: Secondary | ICD-10-CM | POA: Diagnosis not present

## 2018-12-07 DIAGNOSIS — Z20828 Contact with and (suspected) exposure to other viral communicable diseases: Secondary | ICD-10-CM | POA: Diagnosis present

## 2018-12-07 DIAGNOSIS — H3530 Unspecified macular degeneration: Secondary | ICD-10-CM | POA: Diagnosis present

## 2018-12-07 DIAGNOSIS — I341 Nonrheumatic mitral (valve) prolapse: Secondary | ICD-10-CM | POA: Diagnosis present

## 2018-12-07 DIAGNOSIS — I10 Essential (primary) hypertension: Secondary | ICD-10-CM | POA: Diagnosis present

## 2018-12-07 DIAGNOSIS — I2510 Atherosclerotic heart disease of native coronary artery without angina pectoris: Secondary | ICD-10-CM | POA: Diagnosis present

## 2018-12-07 DIAGNOSIS — A419 Sepsis, unspecified organism: Secondary | ICD-10-CM | POA: Diagnosis present

## 2018-12-07 DIAGNOSIS — Z955 Presence of coronary angioplasty implant and graft: Secondary | ICD-10-CM

## 2018-12-07 DIAGNOSIS — Z7982 Long term (current) use of aspirin: Secondary | ICD-10-CM | POA: Diagnosis not present

## 2018-12-07 DIAGNOSIS — I252 Old myocardial infarction: Secondary | ICD-10-CM

## 2018-12-07 DIAGNOSIS — M545 Low back pain: Secondary | ICD-10-CM | POA: Diagnosis present

## 2018-12-07 DIAGNOSIS — E876 Hypokalemia: Secondary | ICD-10-CM | POA: Diagnosis present

## 2018-12-07 DIAGNOSIS — E785 Hyperlipidemia, unspecified: Secondary | ICD-10-CM | POA: Diagnosis present

## 2018-12-07 DIAGNOSIS — I482 Chronic atrial fibrillation, unspecified: Secondary | ICD-10-CM | POA: Diagnosis present

## 2018-12-07 DIAGNOSIS — R197 Diarrhea, unspecified: Secondary | ICD-10-CM | POA: Diagnosis not present

## 2018-12-07 DIAGNOSIS — Z7901 Long term (current) use of anticoagulants: Secondary | ICD-10-CM | POA: Diagnosis not present

## 2018-12-07 DIAGNOSIS — A4151 Sepsis due to Escherichia coli [E. coli]: Principal | ICD-10-CM | POA: Diagnosis present

## 2018-12-07 DIAGNOSIS — R531 Weakness: Secondary | ICD-10-CM

## 2018-12-07 DIAGNOSIS — N390 Urinary tract infection, site not specified: Secondary | ICD-10-CM | POA: Diagnosis present

## 2018-12-07 DIAGNOSIS — E872 Acidosis: Secondary | ICD-10-CM | POA: Diagnosis present

## 2018-12-07 DIAGNOSIS — Z79899 Other long term (current) drug therapy: Secondary | ICD-10-CM | POA: Diagnosis not present

## 2018-12-07 DIAGNOSIS — R6510 Systemic inflammatory response syndrome (SIRS) of non-infectious origin without acute organ dysfunction: Secondary | ICD-10-CM

## 2018-12-07 DIAGNOSIS — Z66 Do not resuscitate: Secondary | ICD-10-CM | POA: Diagnosis present

## 2018-12-07 DIAGNOSIS — M1990 Unspecified osteoarthritis, unspecified site: Secondary | ICD-10-CM | POA: Diagnosis present

## 2018-12-07 DIAGNOSIS — Z888 Allergy status to other drugs, medicaments and biological substances status: Secondary | ICD-10-CM | POA: Diagnosis not present

## 2018-12-07 LAB — URINALYSIS, COMPLETE (UACMP) WITH MICROSCOPIC
Bilirubin Urine: NEGATIVE
Glucose, UA: NEGATIVE mg/dL
Ketones, ur: NEGATIVE mg/dL
Nitrite: POSITIVE — AB
Protein, ur: 30 mg/dL — AB
Specific Gravity, Urine: 1.014 (ref 1.005–1.030)
Squamous Epithelial / LPF: NONE SEEN (ref 0–5)
WBC, UA: >50 WBC/hpf — ABNORMAL HIGH (ref 0–5)
pH: 7 (ref 5.0–8.0)

## 2018-12-07 LAB — COMPREHENSIVE METABOLIC PANEL
ALT: 19 U/L (ref 0–44)
AST: 22 U/L (ref 15–41)
Albumin: 3.1 g/dL — ABNORMAL LOW (ref 3.5–5.0)
Alkaline Phosphatase: 45 U/L (ref 38–126)
Anion gap: 12 (ref 5–15)
BUN: 21 mg/dL (ref 8–23)
CO2: 21 mmol/L — ABNORMAL LOW (ref 22–32)
Calcium: 8.5 mg/dL — ABNORMAL LOW (ref 8.9–10.3)
Chloride: 99 mmol/L (ref 98–111)
Creatinine, Ser: 0.59 mg/dL (ref 0.44–1.00)
GFR calc Af Amer: >60 mL/min (ref >60)
GFR calc non Af Amer: >60 mL/min (ref >60)
Glucose, Bld: 136 mg/dL — ABNORMAL HIGH (ref 70–99)
Potassium: 3.1 mmol/L — ABNORMAL LOW (ref 3.5–5.1)
Sodium: 132 mmol/L — ABNORMAL LOW (ref 135–145)
Total Bilirubin: 1.1 mg/dL (ref 0.3–1.2)
Total Protein: 6.1 g/dL — ABNORMAL LOW (ref 6.5–8.1)

## 2018-12-07 LAB — PROTIME-INR
INR: 2.1 — ABNORMAL HIGH (ref 0.8–1.2)
Prothrombin Time: 23.2 seconds — ABNORMAL HIGH (ref 11.4–15.2)

## 2018-12-07 LAB — CBC WITH DIFFERENTIAL/PLATELET
Abs Immature Granulocytes: 0.21 10*3/uL — ABNORMAL HIGH (ref 0.00–0.07)
Basophils Absolute: 0.1 10*3/uL (ref 0.0–0.1)
Basophils Relative: 0 %
Eosinophils Absolute: 0 10*3/uL (ref 0.0–0.5)
Eosinophils Relative: 0 %
HCT: 36.3 % (ref 36.0–46.0)
Hemoglobin: 12 g/dL (ref 12.0–15.0)
Immature Granulocytes: 1 %
Lymphocytes Relative: 5 %
Lymphs Abs: 1 10*3/uL (ref 0.7–4.0)
MCH: 30.8 pg (ref 26.0–34.0)
MCHC: 33.1 g/dL (ref 30.0–36.0)
MCV: 93.1 fL (ref 80.0–100.0)
Monocytes Absolute: 2.8 10*3/uL — ABNORMAL HIGH (ref 0.1–1.0)
Monocytes Relative: 14 %
Neutro Abs: 16.1 10*3/uL — ABNORMAL HIGH (ref 1.7–7.7)
Neutrophils Relative %: 80 %
Platelets: 245 10*3/uL (ref 150–400)
RBC: 3.9 MIL/uL (ref 3.87–5.11)
RDW: 13.3 % (ref 11.5–15.5)
WBC: 20.2 10*3/uL — ABNORMAL HIGH (ref 4.0–10.5)
nRBC: 0 % (ref 0.0–0.2)

## 2018-12-07 LAB — TROPONIN I (HIGH SENSITIVITY): Troponin I (High Sensitivity): 30 ng/L — ABNORMAL HIGH (ref <18)

## 2018-12-07 LAB — PROCALCITONIN: Procalcitonin: 0.14 ng/mL

## 2018-12-07 LAB — INFLUENZA PANEL BY PCR (TYPE A & B)
Influenza A By PCR: NEGATIVE
Influenza B By PCR: NEGATIVE

## 2018-12-07 LAB — LACTIC ACID, PLASMA
Lactic Acid, Venous: 2.4 mmol/L (ref 0.5–1.9)
Lactic Acid, Venous: 1.2 mmol/L (ref 0.5–1.9)

## 2018-12-07 MED ORDER — PANTOPRAZOLE SODIUM 40 MG PO TBEC
40.0000 mg | DELAYED_RELEASE_TABLET | Freq: Every day | ORAL | Status: DC
  Administered 2018-12-08 – 2018-12-12 (×5): 40 mg via ORAL
  Filled 2018-12-07 (×5): qty 1

## 2018-12-07 MED ORDER — FAMOTIDINE 20 MG PO TABS
20.0000 mg | ORAL_TABLET | Freq: Every day | ORAL | Status: DC
  Administered 2018-12-07 – 2018-12-11 (×5): 20 mg via ORAL
  Filled 2018-12-07 (×5): qty 1

## 2018-12-07 MED ORDER — ACETAMINOPHEN 325 MG PO TABS
650.0000 mg | ORAL_TABLET | Freq: Four times a day (QID) | ORAL | Status: DC | PRN
  Administered 2018-12-08: 650 mg via ORAL
  Filled 2018-12-07: qty 2

## 2018-12-07 MED ORDER — ISOSORBIDE MONONITRATE ER 30 MG PO TB24
30.0000 mg | ORAL_TABLET | Freq: Every day | ORAL | Status: DC
  Administered 2018-12-08 – 2018-12-12 (×5): 30 mg via ORAL
  Filled 2018-12-07 (×5): qty 1

## 2018-12-07 MED ORDER — ASPIRIN EC 81 MG PO TBEC
81.0000 mg | DELAYED_RELEASE_TABLET | Freq: Every day | ORAL | Status: DC
  Administered 2018-12-08 – 2018-12-12 (×5): 81 mg via ORAL
  Filled 2018-12-07 (×5): qty 1

## 2018-12-07 MED ORDER — WARFARIN SODIUM 5 MG PO TABS
5.0000 mg | ORAL_TABLET | ORAL | Status: DC
  Filled 2018-12-07: qty 1

## 2018-12-07 MED ORDER — SODIUM CHLORIDE 0.9 % IV SOLN
1.0000 g | INTRAVENOUS | Status: DC
  Administered 2018-12-07 – 2018-12-09 (×3): 1 g via INTRAVENOUS
  Filled 2018-12-07: qty 1
  Filled 2018-12-07: qty 10
  Filled 2018-12-07 (×2): qty 1

## 2018-12-07 MED ORDER — VANCOMYCIN HCL IN DEXTROSE 1-5 GM/200ML-% IV SOLN
1000.0000 mg | Freq: Once | INTRAVENOUS | Status: DC
  Filled 2018-12-07: qty 200

## 2018-12-07 MED ORDER — WARFARIN SODIUM 2.5 MG PO TABS
2.5000 mg | ORAL_TABLET | ORAL | Status: DC
  Administered 2018-12-07: 2.5 mg via ORAL
  Filled 2018-12-07: qty 1

## 2018-12-07 MED ORDER — ONDANSETRON HCL 4 MG PO TABS
4.0000 mg | ORAL_TABLET | Freq: Four times a day (QID) | ORAL | Status: DC | PRN
  Administered 2018-12-08: 4 mg via ORAL
  Filled 2018-12-07: qty 1

## 2018-12-07 MED ORDER — LORATADINE 10 MG PO TABS
10.0000 mg | ORAL_TABLET | Freq: Every day | ORAL | Status: DC | PRN
  Administered 2018-12-09: 10 mg via ORAL
  Filled 2018-12-07 (×2): qty 1

## 2018-12-07 MED ORDER — ONDANSETRON HCL 4 MG/2ML IJ SOLN: 4.0000 mg | Freq: Four times a day (QID) | INTRAMUSCULAR | Status: DC | PRN

## 2018-12-07 MED ORDER — TRAMADOL HCL 50 MG PO TABS
50.0000 mg | ORAL_TABLET | Freq: Every day | ORAL | Status: DC
  Administered 2018-12-08 – 2018-12-12 (×5): 50 mg via ORAL
  Filled 2018-12-07 (×5): qty 1

## 2018-12-07 MED ORDER — ACETAMINOPHEN 500 MG PO TABS
1000.0000 mg | ORAL_TABLET | Freq: Once | ORAL | Status: AC
  Administered 2018-12-07: 1000 mg via ORAL
  Filled 2018-12-07: qty 2

## 2018-12-07 MED ORDER — SODIUM CHLORIDE 0.9 % IV SOLN
2.0000 g | Freq: Once | INTRAVENOUS | Status: AC
  Administered 2018-12-07: 2 g via INTRAVENOUS
  Filled 2018-12-07: qty 2

## 2018-12-07 MED ORDER — LACTATED RINGERS IV SOLN
INTRAVENOUS | Status: DC
  Administered 2018-12-07 – 2018-12-10 (×3): via INTRAVENOUS

## 2018-12-07 MED ORDER — WARFARIN - PHARMACIST DOSING INPATIENT
Freq: Every day | Status: DC
  Administered 2018-12-10: 18:00:00
  Filled 2018-12-07: qty 1

## 2018-12-07 MED ORDER — ACETAMINOPHEN 650 MG RE SUPP
650.0000 mg | Freq: Four times a day (QID) | RECTAL | Status: DC | PRN
  Filled 2018-12-07: qty 1

## 2018-12-07 MED ORDER — SIMVASTATIN 10 MG PO TABS
10.0000 mg | ORAL_TABLET | Freq: Every day | ORAL | Status: DC
  Administered 2018-12-07 – 2018-12-11 (×5): 10 mg via ORAL
  Filled 2018-12-07 (×6): qty 1

## 2018-12-07 MED ORDER — SODIUM CHLORIDE 0.9 % IV BOLUS
1000.0000 mL | Freq: Once | INTRAVENOUS | Status: AC
  Administered 2018-12-07: 1000 mL via INTRAVENOUS

## 2018-12-07 NOTE — H&P (Signed)
[facility] History and Physical   Patient: Cynthia Simon OQH:[id number]   PCP: Rusty Aus, MD DOB: 20-Oct-1930   DOA: 12/07/2018   DOS: 12/07/2018   DOS: the patient was seen and examined on 12/07/2018  Patient coming from: The patient is coming from Home  Chief Complaint: Generalized fatigue and tiredness since this morning  HPI: Cynthia Simon is a 85 y.o. female with Past medical history of CAD, A. fib, HTN, chronic anticoagulation. Patient presented with complaints of generalized fatigue that started this morning.  Patient mentions that she was at her baseline when she went to sleep last night. When she woke up this morning she was severely fatigue and was unable to do any activity. She also reported left sided chest rib pain which she never had before. This pain lasted for few minutes and resolved on its own. She denies any nausea or vomiting denies any dizziness or lightheadedness. She had some chills but no fever. No diarrhea no constipation. No recent change in medications.    ED Course: Presented with above complaint.  Had leukocytosis, fever, meeting SIRS criteria.  Patient's UA was showing nitrate positive pyuria and patient was referred for admission for possible sepsis secondary to UTI.  At her baseline ambulates with assistance independent for most of her ADL;  manages her medication on her own.  Review of Systems: as mentioned in the history of present illness.  All other systems reviewed and are negative.  Past Medical History:  Diagnosis Date  . Arthritis   . Carotid artery disease ([place])    BLOCKAGE  . Coronary artery disease   . Dysrhythmia    AFIB  . HOH (hard of hearing)   . Hypertension   . MVP (mitral valve prolapse)   . Myocardial infarction ([place])    MILD  . Palpitations    Past Surgical History:  Procedure Laterality Date  . BREAST SURGERY Left    BIOPSY  . CATARACT EXTRACTION W/PHACO Left 01/18/2016   Procedure: CATARACT  EXTRACTION PHACO AND INTRAOCULAR LENS PLACEMENT (IOC);  Surgeon: Birder Robson, MD;  Location: [facility] ORS;  Service: Ophthalmology;  Laterality: Left;  PACK TWS:[id number] H US:01:40 AP:59.4 CDE:26.44  . CORONARY ANGIOPLASTY     STENT  . DILATION AND CURETTAGE OF UTERUS     Social History:  reports that she has never smoked. She has never used smokeless tobacco. She reports that she does not drink alcohol or use drugs.  Allergies  Allergen Reactions  . Amlodipine Besylate Swelling    In feet and ankles    Family history reviewed and not pertinent   Prior to Admission medications   Medication Sig Start Date End Date Taking? Authorizing Provider  Aflibercept 2 MG/0.05ML SOLN Apply 0.05 mLs to eye every 30 (thirty) days.   Yes [provider]  aspirin EC 81 MG tablet Take 81 mg by mouth daily.   Yes [provider]  famotidine (PEPCID) 20 MG tablet Take 20 mg by mouth at bedtime. 09/20/18 09/20/19 Yes [provider]  hydrochlorothiazide (HYDRODIURIL) 25 MG tablet Take 25 mg by mouth daily.   Yes [provider]  isosorbide mononitrate (IMDUR) 30 MG 24 hr tablet Take 30 mg by mouth daily.   Yes [provider]  loratadine (CLARITIN) 10 MG tablet Take 10 mg by mouth daily as needed for allergies.    Yes [provider]  losartan (COZAAR) 50 MG tablet Take 50 mg by mouth daily.   Yes [provider]  metoprolol tartrate (LOPRESSOR) 25 MG tablet Take 12.5 mg by mouth 2 (two) times daily.    Yes [provider]  nitroGLYCERIN (NITROSTAT) 0.4 MG SL tablet Place 0.4 mg under the tongue every 5 (five) minutes as needed for chest pain.   Yes [provider]  pantoprazole (PROTONIX) 40 MG tablet Take 40 mg by mouth daily.   Yes [provider]  simvastatin (ZOCOR) 10 MG tablet Take 10 mg by mouth at bedtime.    Yes [provider]  traMADol (ULTRAM) 50 MG tablet Take 50 mg by mouth daily.    Yes  [provider]  warfarin (COUMADIN) 5 MG tablet Take 2.5-5 mg by mouth See admin instructions. Warfarin 5 mg daily at bedtime alternating with warfarin 2.5 mg daily at bedtime   Yes [provider]    Physical Exam: Vitals:   12/07/18 1400 12/07/18 1505 12/07/18 1631 12/07/18 1720  BP: (!) 131/57 129/65 131/71 (!) 134/56  Pulse: 88 77 72 69  Resp: (!) 21 19 18 13   Temp:      TempSrc:      SpO2: 97% 96% 97% 96%  Weight:      Height:        General: alert and oriented to time, place, and person. Appear in mild distress, affect appropriate Eyes: PERRL, Conjunctiva normal ENT: Oral Mucosa Clear, moist  Neck: no JVD, no Abnormal Mass Or lumps Cardiovascular: S1 and S2 Present, no Murmur, peripheral pulses symmetrical Respiratory: good respiratory effort, Bilateral Air entry equal and Decreased, no signs of accessory muscle use, Clear to Auscultation, no Crackles, no wheezes Abdomen: Bowel Sound present, Soft and no CVA tenderness, no hernia Skin: no rashes  Extremities: no Pedal edema, no calf tenderness Neurologic: without any new focal findings Gait not checked due to patient safety concerns  Data Reviewed: I have personally reviewed and interpreted labs, imaging as discussed below.  CBC: Recent Labs  Lab 12/07/18 1206  WBC 20.2*  NEUTROABS 16.1*  HGB 12.0  HCT 36.3  MCV 93.1  PLT 245   Basic Metabolic Panel: Recent Labs  Lab 12/07/18 1206  NA 132*  K 3.1*  CL 99  CO2 21*  GLUCOSE 136*  BUN 21  CREATININE 0.59  CALCIUM 8.5*   GFR: Estimated Creatinine Clearance: 46 mL/min (by C-G formula based on SCr of 0.59 mg/dL). Liver Function Tests: Recent Labs  Lab 12/07/18 1206  AST 22  ALT 19  ALKPHOS 45  BILITOT 1.1  PROT 6.1*  ALBUMIN 3.1*   No results for input(s): LIPASE, AMYLASE in the last 168 hours. No results for input(s): AMMONIA in the last 168 hours. Coagulation Profile: Recent Labs  Lab 12/07/18 1206  INR 2.1*   Cardiac  Enzymes: No results for input(s): CKTOTAL, CKMB, CKMBINDEX, TROPONINI in the last 168 hours. BNP (last 3 results) No results for input(s): PROBNP in the last 8760 hours. HbA1C: No results for input(s): HGBA1C in the last 72 hours. CBG: No results for input(s): GLUCAP in the last 168 hours. Lipid Profile: No results for input(s): CHOL, HDL, LDLCALC, TRIG, CHOLHDL, LDLDIRECT in the last 72 hours. Thyroid Function Tests: No results for input(s): TSH, T4TOTAL, FREET4, T3FREE, THYROIDAB in the last 72 hours. Anemia Panel: No results for input(s): VITAMINB12, FOLATE, FERRITIN, TIBC, IRON, RETICCTPCT in the last 72 hours. Urine analysis:    Component Value Date/Time   COLORURINE YELLOW (A) 12/07/2018 1222   APPEARANCEUR HAZY (A) 12/07/2018 1222   LABSPEC 1.014  12/07/2018 1222   PHURINE 7.0 12/07/2018 1222   GLUCOSEU NEGATIVE 12/07/2018 1222   HGBUR MODERATE (A) 12/07/2018 1222   BILIRUBINUR NEGATIVE 12/07/2018 1222   KETONESUR NEGATIVE 12/07/2018 1222   PROTEINUR 30 (A) 12/07/2018 1222   NITRITE POSITIVE (A) 12/07/2018 1222   LEUKOCYTESUR SMALL (A) 12/07/2018 1222    Radiological Exams on Admission: Dg Chest 2 View  Result Date: 12/07/2018 CLINICAL DATA:  85 year old female with a history of shortness of breath and fever EXAM: CHEST - 2 VIEW COMPARISON:  11/29/2017 FINDINGS: Cardiomediastinal silhouette unchanged in size and contour. No pneumothorax or pleural effusion. No confluent airspace disease. Coarsened interstitial markings bilateral lungs. Pleuroparenchymal thickening at the apices is unchanged stigmata of emphysema, with increased retrosternal airspace, flattened hemidiaphragms, increased AP diameter, and hyperinflation on the AP view. Degenerative changes the spine.  No acute displaced fracture. IMPRESSION: Chronic lung changes and emphysema without evidence of acute cardiopulmonary disease Electronically Signed   By: Gilmer MorJaime  Wagner D.O.   On: 12/07/2018 13:06   I reviewed all  nursing notes, pharmacy notes, vitals, pertinent old records.  Assessment/Plan 1.  Sepsis POA due to UTI. Leukocytosis white count of 20,000. Fever temperature of 101.7.  Tachypneic as well. Lactic acidosis with elevated procalcitonin. Meeting sepsis criteria with UA showing nitrite positive pyuria. Patient does not have any CVA tenderness but did have some left rib cage pain which is currently resolved. We will continue with IV ceftriaxone continue with IV hydration follow-up on cultures.  2.  Hypokalemia. Replacing orally.  3.  Essential hypertension. Patient is on hydrochlorothiazide, Imdur, metoprolol losartan. Currently holding all medications other than Imdur.  Monitor.  4.  GERD. Continuing PPI.  5.  Macular degeneration. Monitor.  6.  Chronic A. fib. Currently rate controlled but still A. fib. Continue with anticoagulation with warfarin per pharmacy.  Nutrition: Cardiac diet DVT Prophylaxis: Therapeutic Anticoagulation with Warfarin  Advance goals of care discussion: DNR   Consults: none  Family Communication: no family was present at bedside, at the time of interview.   Disposition: Admitted as inpatient, telemetry unit. Likely to be discharged home, in 2-3 days.  I have discussed plan of care as described above with RN and patient/family.  Author: Lynden OxfordPranav Hollyn Stucky, MD [facility] 12/07/2018 6:16 PM   To reach On-call, see care teams to locate the attending and reach out to them via [email or web address]. If 7PM-7AM, please contact night-coverage If you still have difficulty reaching the attending provider, please page the [facility] (Director on Call) for [facility] on amion for assistance.

## 2018-12-07 NOTE — ED Notes (Signed)
Patient given meal. 

## 2018-12-07 NOTE — Progress Notes (Signed)
ANTICOAGULATION CONSULT NOTE - Initial Consult  Pharmacy Consult for Warfarin  Indication: atrial fibrillation  Allergies  Allergen Reactions  . Amlodipine Besylate Swelling    In feet and ankles    Patient Measurements: Height: 5\' 5"  (165.1 cm) Weight: 125 lb (56.7 kg) IBW/kg (Calculated) : 57 Heparin Dosing Weight:   Vital Signs: Temp: 101.7 F (38.7 C) (11/07 1210) Temp Source: Oral (11/07 1210) BP: 134/56 (11/07 1720) Pulse Rate: 69 (11/07 1720)  Labs: Recent Labs    12/07/18 1206  HGB 12.0  HCT 36.3  PLT 245  LABPROT 23.2*  INR 2.1*  CREATININE 0.59  TROPONINIHS 30*    Estimated Creatinine Clearance: 46 mL/min (by C-G formula based on SCr of 0.59 mg/dL).   Medical History: Past Medical History:  Diagnosis Date  . Arthritis   . Carotid artery disease ([place])    BLOCKAGE  . Coronary artery disease   . Dysrhythmia    AFIB  . HOH (hard of hearing)   . Hypertension   . MVP (mitral valve prolapse)   . Myocardial infarction ([place])    MILD  . Palpitations     Medications:  (Not in a hospital admission)   Assessment: Pharmacy consulted to dose warfarin in this 85 year old female admitted with Afib.  On warfarin PTA.  Pt takes warfarin 5 mg PO QOD alternating with warfarin 2.5 mg PO QOD.   Last dose was 5 mg on 11/6.   11/7: INR = 2.1  Goal of Therapy:  INR 2-3   Plan:  Will continue pt's home warfarin regimen.  Will order warfarin 2.5 mg PO QOD to start 11/7 @ 1900 alternating with warfarin 5 mg PO QOD to start 11/8 @ 1800. Will monitor INR, CBC daily.   Jarry Manon D 12/07/2018,7:05 PM

## 2018-12-07 NOTE — ED Provider Notes (Signed)
[facility] Emergency Department Provider Note  ____________________________________________   First MD Initiated Contact with Patient 12/07/18 1205     (approximate)  I have reviewed the triage vital signs and the nursing notes.  History  Chief Complaint Shortness of Breath    HPI Cynthia Simon is a 85 y.o. female with history of CAD, atrial fibrillation, on warfarin who presents to the emergency department for generalized fatigue, weakness, nausea and mild shortness of breath.  Patient states she woke up this morning feeling generally unwell.  She felt more fatigued than usual, especially with exertion.  She reports some mild shortness of breath but denies any cough.  She denies any chest pain, abdominal pain.  She does have some associated nausea without vomiting.  She reports some loose stool but no frank diarrhea.  She denies any sick contacts.  She denies any fevers or chills, however was noted to be febrile to 101.7 on arrival here.  She denies any dysuria or malodorous urine, but states she does have a history of UTI.   Past Medical Hx Past Medical History:  Diagnosis Date  . Arthritis   . Carotid artery disease ([facility])    BLOCKAGE  . Coronary artery disease   . Dysrhythmia    AFIB  . HOH (hard of hearing)   . Hypertension   . MVP (mitral valve prolapse)   . Myocardial infarction ([facility])    MILD  . Palpitations     Problem List There are no active problems to display for this patient.   Past Surgical Hx Past Surgical History:  Procedure Laterality Date  . BREAST SURGERY Left    BIOPSY  . CATARACT EXTRACTION W/PHACO Left 01/18/2016   Procedure: CATARACT EXTRACTION PHACO AND INTRAOCULAR LENS PLACEMENT (IOC);  Surgeon: Galen Manila, MD;  Location: [facility] ORS;  Service: Ophthalmology;  Laterality: Left;  PACK MPN:[id number] H US:01:40 AP:59.4 CDE:26.44  . CORONARY ANGIOPLASTY     STENT  . DILATION AND CURETTAGE OF UTERUS       Medications Prior to Admission medications   Medication Sig Start Date End Date Taking? Authorizing Provider  Aflibercept 2 MG/0.05ML SOLN Apply 0.05 mLs to eye every 30 (thirty) days.    [provider]  aspirin EC 81 MG tablet Take 81 mg by mouth daily.    [provider]  hydrochlorothiazide (HYDRODIURIL) 25 MG tablet Take 25 mg by mouth daily.    [provider]  ibuprofen (ADVIL,MOTRIN) 200 MG tablet Take 200 mg by mouth at bedtime.    [provider]  isosorbide mononitrate (IMDUR) 30 MG 24 hr tablet Take 30 mg by mouth daily.    [provider]  loratadine (CLARITIN) 10 MG tablet Take 10 mg by mouth daily as needed for allergies.     [provider]  losartan (COZAAR) 50 MG tablet Take 50 mg by mouth daily.    [provider]  metoprolol tartrate (LOPRESSOR) 25 MG tablet Take 12.5 mg by mouth 2 (two) times daily.     [provider]  nitroGLYCERIN (NITROSTAT) 0.4 MG SL tablet Place 0.4 mg under the tongue every 5 (five) minutes as needed for chest pain.    [provider]  pantoprazole (PROTONIX) 40 MG tablet Take 40 mg by mouth daily.    [provider]  Polyethyl Glycol-Propyl Glycol (SYSTANE OP) Place 1 drop into both eyes daily as needed (dry eyes).    [provider]  ranitidine (ZANTAC) 150 MG capsule  Take 150 mg by mouth at bedtime.     [provider]  simvastatin (ZOCOR) 10 MG tablet Take 10 mg by mouth at bedtime.     [provider]  traMADol (ULTRAM) 50 MG tablet Take 50 mg by mouth daily.     [provider]  warfarin (COUMADIN) 5 MG tablet Take 2.5-5 mg by mouth See admin instructions. Warfarin 5 mg daily at bedtime alternating with warfarin 2.5 mg daily at bedtime    [provider]    Allergies Amlodipine besylate  Family Hx No family history on file.  Social Hx Social History   Tobacco Use  . Smoking status: Never Smoker  .  Smokeless tobacco: Never Used  Substance Use Topics  . Alcohol use: No  . Drug use: No     Review of Systems  Constitutional: + fatigue Eyes: Negative for visual changes. ENT: Negative for sore throat. Cardiovascular: Negative for chest pain. Respiratory: Negative for shortness of breath. Gastrointestinal: + nausea Genitourinary: Negative for dysuria. Musculoskeletal: Negative for leg swelling. Skin: Negative for rash. Neurological: Negative for for headaches.   Physical Exam  Vital Signs: ED Triage Vitals  Enc Vitals Group     BP 12/07/18 1210 (!) 159/66     Pulse Rate 12/07/18 1210 85     Resp 12/07/18 1210 17     Temp 12/07/18 1210 (!) 101.7 F (38.7 C)     Temp Source 12/07/18 1210 Oral     SpO2 12/07/18 1210 96 %     Weight 12/07/18 1213 125 lb (56.7 kg)     Height 12/07/18 1213 5\' 5"  (1.651 m)     Head Circumference --      Peak Flow --      Pain Score 12/07/18 1212 0     Pain Loc --      Pain Edu? --      Excl. in GC? --      Constitutional: Alert and oriented.  Head: Normocephalic. Atraumatic. Eyes: Conjunctivae clear. Sclera anicteric. Nose: No congestion. No rhinorrhea. Mouth/Throat: Mucous membranes are moist.  Neck: No stridor.   Cardiovascular: Irregular, HR within normal limits. Extremities well perfused. Respiratory: Normal respiratory effort.  Lungs CTAB. Gastrointestinal: Soft. Non-tender. Non-distended.  Musculoskeletal: No lower extremity edema. No deformities. Neurologic:  Normal speech and language. No gross focal neurologic deficits are appreciated.  Skin: Skin is warm, dry and intact. No rash noted. Psychiatric: Mood and affect are appropriate for situation.  EKG  N/A   Radiology  CXR: IMPRESSION:  Chronic lung changes and emphysema without evidence of acute  cardiopulmonary disease    Procedures  Procedure(s) performed (including critical care):  .Critical Care Performed by: Miguel AschoffMonks, Lysha Schrade L., MD Authorized by: Miguel AschoffMonks,  Ryla Cauthon L., MD   Critical care provider statement:    Critical care time (minutes):  45   Critical care was necessary to treat or prevent imminent or life-threatening deterioration of the following conditions:  Sepsis   Critical care was time spent personally by me on the following activities:  Discussions with consultants, evaluation of patient's response to treatment, examination of patient, ordering and performing treatments and interventions, ordering and review of laboratory studies, ordering and review of radiographic studies, pulse oximetry, re-evaluation of patient's condition, obtaining history from patient or surrogate and review of old charts     Initial Impression / Assessment and Plan / ED Course  85 y.o. female who presents to the ED for generalized fatigue, weakness, nausea, shortness of  breath.  Ddx: pulmonary infection, UTI, COVID, flu  Work-up is consistent with urosepsis.  UA overwhelmingly positive for infection.  Leukocytosis to 20.2 with lactic acid 2.4.  Receiving fluids and antibiotics.  Will admit.   Final Clinical Impression(s) / ED Diagnosis  Final diagnoses:  Urinary tract infection in elderly patient  SIRS (systemic inflammatory response syndrome) ([place])  Weakness       Note:  This document was prepared using Dragon voice recognition software and may include unintentional dictation errors.   Lilia Pro., MD 12/07/18 [id number]

## 2018-12-07 NOTE — ED Triage Notes (Signed)
Pt arrived via [facility] from home c/o nausea, weakness, and SOB. EMS put pt on 2L 02 [place]. CBG 143 per EMS. Pt has hx of AFIB.

## 2018-12-07 NOTE — Consult Note (Signed)
PHARMACY -  BRIEF ANTIBIOTIC NOTE   Pharmacy has received consult(s) for Vancomycin and Cefepime from an ED provider.  The patient's profile has been reviewed for ht/wt/allergies/indication/available labs.    One time order(s) placed for Vancomycin 1000mg  and Cefepime 2mg .   Further antibiotics/pharmacy consults should be ordered by admitting physician if indicated.                       Thank you, Pernell Dupre, PharmD, BCPS Clinical Pharmacist 12/07/2018 1:41 PM

## 2018-12-07 NOTE — ED Notes (Signed)
Pt given dinner tray and drink 

## 2018-12-08 LAB — COMPREHENSIVE METABOLIC PANEL
ALT: 16 U/L (ref 0–44)
AST: 16 U/L (ref 15–41)
Albumin: 2.7 g/dL — ABNORMAL LOW (ref 3.5–5.0)
Alkaline Phosphatase: 43 U/L (ref 38–126)
Anion gap: 9 (ref 5–15)
BUN: 14 mg/dL (ref 8–23)
CO2: 24 mmol/L (ref 22–32)
Calcium: 8.3 mg/dL — ABNORMAL LOW (ref 8.9–10.3)
Chloride: 99 mmol/L (ref 98–111)
Creatinine, Ser: 0.51 mg/dL (ref 0.44–1.00)
GFR calc Af Amer: >60 mL/min (ref >60)
GFR calc non Af Amer: >60 mL/min (ref >60)
Glucose, Bld: 114 mg/dL — ABNORMAL HIGH (ref 70–99)
Potassium: 2.7 mmol/L — CL (ref 3.5–5.1)
Sodium: 132 mmol/L — ABNORMAL LOW (ref 135–145)
Total Bilirubin: 1.1 mg/dL (ref 0.3–1.2)
Total Protein: 5.7 g/dL — ABNORMAL LOW (ref 6.5–8.1)

## 2018-12-08 LAB — CBC
HCT: 32.1 % — ABNORMAL LOW (ref 36.0–46.0)
Hemoglobin: 11.1 g/dL — ABNORMAL LOW (ref 12.0–15.0)
MCH: 31.1 pg (ref 26.0–34.0)
MCHC: 34.6 g/dL (ref 30.0–36.0)
MCV: 89.9 fL (ref 80.0–100.0)
Platelets: 204 10*3/uL (ref 150–400)
RBC: 3.57 MIL/uL — ABNORMAL LOW (ref 3.87–5.11)
RDW: 13.5 % (ref 11.5–15.5)
WBC: 17.7 10*3/uL — ABNORMAL HIGH (ref 4.0–10.5)
nRBC: 0 % (ref 0.0–0.2)

## 2018-12-08 LAB — PROTIME-INR
INR: 2.5 — ABNORMAL HIGH (ref 0.8–1.2)
Prothrombin Time: 26.7 seconds — ABNORMAL HIGH (ref 11.4–15.2)

## 2018-12-08 LAB — SARS CORONAVIRUS 2 (TAT 6-24 HRS): SARS Coronavirus 2: NEGATIVE

## 2018-12-08 MED ORDER — METOPROLOL TARTRATE 25 MG PO TABS
12.5000 mg | ORAL_TABLET | Freq: Two times a day (BID) | ORAL | Status: DC
  Administered 2018-12-08 – 2018-12-12 (×9): 12.5 mg via ORAL
  Filled 2018-12-08 (×9): qty 1

## 2018-12-08 MED ORDER — POTASSIUM CHLORIDE CRYS ER 20 MEQ PO TBCR
40.0000 meq | EXTENDED_RELEASE_TABLET | ORAL | Status: AC
  Administered 2018-12-08 (×2): 40 meq via ORAL
  Filled 2018-12-08 (×2): qty 2

## 2018-12-08 MED ORDER — WARFARIN SODIUM 2.5 MG PO TABS
2.5000 mg | ORAL_TABLET | Freq: Once | ORAL | Status: AC
  Administered 2018-12-08: 2.5 mg via ORAL
  Filled 2018-12-08: qty 1

## 2018-12-08 NOTE — Progress Notes (Signed)
MD notified;FYI,lab called with critical lab potassium level 2.7.

## 2018-12-08 NOTE — Progress Notes (Signed)
ANTICOAGULATION CONSULT NOTE - Initial Consult  Pharmacy Consult for Warfarin  Indication: atrial fibrillation  Allergies  Allergen Reactions  . Amlodipine Besylate Swelling    In feet and ankles    Patient Measurements: Height: 5\' 5"  (165.1 cm) Weight: 125 lb (56.7 kg) IBW/kg (Calculated) : 57  Vital Signs: Temp: 100.3 F (37.9 C) (11/08 0423) Temp Source: Oral (11/08 0423) BP: 163/57 (11/08 0423) Pulse Rate: 99 (11/08 0423)  Labs: Recent Labs    12/07/18 1206 12/08/18 0651  HGB 12.0 11.1*  HCT 36.3 32.1*  PLT 245 204  LABPROT 23.2* 26.7*  INR 2.1* 2.5*  CREATININE 0.59 0.51  TROPONINIHS 30*  --     Estimated Creatinine Clearance: 46 mL/min (by C-G formula based on SCr of 0.51 mg/dL).   Medical History: Past Medical History:  Diagnosis Date  . Arthritis   . Carotid artery disease ([place])    BLOCKAGE  . Coronary artery disease   . Dysrhythmia    AFIB  . HOH (hard of hearing)   . Hypertension   . MVP (mitral valve prolapse)   . Myocardial infarction ([place])    MILD  . Palpitations     Medications:  Medications Prior to Admission  Medication Sig Dispense Refill Last Dose  . Aflibercept 2 MG/0.05ML SOLN Apply 0.05 mLs to eye every 30 (thirty) days.   Past Month at Unknown time  . aspirin EC 81 MG tablet Take 81 mg by mouth daily.   12/07/2018 at 1000  . famotidine (PEPCID) 20 MG tablet Take 20 mg by mouth at bedtime.   12/06/2018 at Unknown time  . hydrochlorothiazide (HYDRODIURIL) 25 MG tablet Take 25 mg by mouth daily.   12/07/2018 at 1000  . isosorbide mononitrate (IMDUR) 30 MG 24 hr tablet Take 30 mg by mouth daily.   12/07/2018 at 1000  . loratadine (CLARITIN) 10 MG tablet Take 10 mg by mouth daily as needed for allergies.    prn at prn  . losartan (COZAAR) 50 MG tablet Take 50 mg by mouth daily.   12/06/2018 at Unknown time  . metoprolol tartrate (LOPRESSOR) 25 MG tablet Take 12.5 mg by mouth 2 (two) times daily.    12/07/2018 at 1000  . nitroGLYCERIN  (NITROSTAT) 0.4 MG SL tablet Place 0.4 mg under the tongue every 5 (five) minutes as needed for chest pain.   prn at prn  . pantoprazole (PROTONIX) 40 MG tablet Take 40 mg by mouth daily.   12/07/2018 at 1000  . simvastatin (ZOCOR) 10 MG tablet Take 10 mg by mouth at bedtime.    12/06/2018 at Unknown time  . traMADol (ULTRAM) 50 MG tablet Take 50 mg by mouth daily.    12/07/2018 at 1000  . warfarin (COUMADIN) 5 MG tablet Take 2.5-5 mg by mouth See admin instructions. Warfarin 5 mg daily at bedtime alternating with warfarin 2.5 mg daily at bedtime   12/06/2018 at Unknown time    Assessment: Pharmacy consulted to dose warfarin in this 85 year old female admitted with Afib.  On warfarin PTA.  Pt takes warfarin 5 mg PO QOD alternating with warfarin 2.5 mg PO QOD.   Last dose was 5 mg on 11/6. 11/7: INR = 2.1  Date INR Warfarin Dose  11/7 2.1 2.5 mg  11/8 2.5 2.5 mg    Goal of Therapy:  INR 2-3   Plan:  INR is therapeutic. Will order warfarin 2.5 mg. Daily INR ordered. CBC q3 days.   Oswald Hillock,  PharmD, BCPS 12/08/2018,11:30 AM

## 2018-12-08 NOTE — Progress Notes (Signed)
MD  Notified in person of lab results potassium level 2.7, prothrombin time 26.7, INR 2.5 which have increased from yesterday. Patient is currently on Warfarin.

## 2018-12-08 NOTE — Progress Notes (Signed)
MD notified: I spoke with PT. They said they will not be able to work with the patient until his potassium level is above 3.0 at least. She also has has two wound on her lower left leg. She has been using a antibiotic ointment and cleaning the sites with hydrogen peroxide. Can you order an antibiotic ointment for he please. I will try to walk with the patient some to see how she does.

## 2018-12-08 NOTE — Progress Notes (Addendum)
PT Cancellation Note  Patient Details Name: Cynthia Simon MRN: [id number] DOB: July 17, 1930   Cancelled Treatment:    Reason Eval/Treat Not Completed: Medical issues which prohibited therapy(Chart reviewed. Most recent K+: 2.7 outside of safe range to participate in PT. Will continue to follow remotely and evaluate once appropriate.)   9:24 AM, 12/08/18 [name], PT, DPT Physical Therapist - [facility]  [phone] ([place])    [name] C 12/08/2018, 9:24 AM

## 2018-12-08 NOTE — Progress Notes (Signed)
PROGRESS NOTE  Cynthia Simon  DOB: 19-Feb-1930  PCP: Rusty Aus, MD NUU:[id number]  DOA: 12/07/2018  LOS: 1 day   Brief narrative: Cynthia Simon is a 85 y.o.f with PMH of HTN, MVP, A. fib on anticoagulation, CAD s/p stent. Patient presented to the ED on 12/06/2020 from home with complaint of generalized fatigue, lower back pain. In the ED, she had a temperature of 101.7, otherwise hemodynamically stable. Work-up showed WBC count elevated to 20.2, lactic acid was elevated to 2.4. Urinalysis showed hazy yellow urine with small leukocytes, positive nitrite and many bacteria. She was admitted under hospitalist service for sepsis secondary to UTI.  Subjective: Patient was seen and examined this morning.  Pleasant elderly Caucasian female.  Propped up in bed.  Not in distress.  No new symptoms.  Feels better than yesterday.  Assessment/Plan: Sepsis secondary to UTI -Urinalysis showed hazy yellow urine with small leukocytes, positive nitrite and many bacteria. -Met sepsis criteria on admission. -Currently on IV Rocephin.  Pending urine culture report. -Lactic acid level improved with IV hydration. -No CVA tenderness on my examination today.  Hypokalemia. -Potassium low at 3.1 on admission.  Despite replacement, potassium was low at 2.7 this morning.  Oral replacement given with KCl 80 mEq.  Recheck potassium level tomorrow.  Cardiovascular issues: HTN/ MVP/ A. Fib/ CAD s/p stent. -Home meds include HCTZ, Imdur, losartan, metoprolol, simvastatin, Coumadin. -Resume metoprolol, Imdur, simvastatin and Coumadin.  HCTZ and losartan on hold. -Pharmacy to dose Coumadin to keep INR between 2-3.  GERD -Continuing PPI  Body mass index is 20.8 kg/m. Mobility: Reports independent ambulation at home.  Encourage ambulation. DVT prophylaxis:  Coumadin Code Status:   Code Status: DNR  Family Communication:  Expected Discharge:  Hopefully home in 1 to 2  days.  Consultants:  None  Procedures:  None  Antimicrobials: Anti-infectives (From admission, onward)   Start     Dose/Rate Route Frequency Ordered Stop   12/07/18 2200  cefTRIAXone (ROCEPHIN) 1 g in sodium chloride 0.9 % 100 mL IVPB     1 g 200 mL/hr over 30 Minutes Intravenous Every 24 hours 12/07/18 1503     12/07/18 1345  ceFEPIme (MAXIPIME) 2 g in sodium chloride 0.9 % 100 mL IVPB     2 g 200 mL/hr over 30 Minutes Intravenous  Once 12/07/18 1330 12/07/18 1436   12/07/18 1345  vancomycin (VANCOCIN) IVPB 1000 mg/200 mL premix  Status:  Discontinued     1,000 mg 200 mL/hr over 60 Minutes Intravenous  Once 12/07/18 1330 12/07/18 1347      Diet Order            Diet regular Room service appropriate? Yes; Fluid consistency: Thin  Diet effective now              Infusions:  . cefTRIAXone (ROCEPHIN)  IV 1 g (12/07/18 2339)  . lactated ringers 75 mL/hr at 12/08/18 0923    Scheduled Meds: . aspirin EC  81 mg Oral Daily  . famotidine  20 mg Oral QHS  . isosorbide mononitrate  30 mg Oral Daily  . pantoprazole  40 mg Oral Daily  . potassium chloride  40 mEq Oral Q2H  . simvastatin  10 mg Oral QHS  . traMADol  50 mg Oral Daily  . warfarin  2.5 mg Oral QODAY  . warfarin  5 mg Oral QODAY  . Warfarin - Pharmacist Dosing Inpatient   Does not apply q1800    PRN meds:  acetaminophen **OR** acetaminophen, loratadine, ondansetron **OR** ondansetron (ZOFRAN) IV   Objective: Vitals:   12/07/18 2115 12/08/18 0423  BP: 97/83 (!) 163/57  Pulse: 69 99  Resp: 17 17  Temp: 98.4 F (36.9 C) 100.3 F (37.9 C)  SpO2: 98% 96%    Intake/Output Summary (Last 24 hours) at 12/08/2018 1113 Last data filed at 12/08/2018 1104 Gross per 24 hour  Intake 1298.74 ml  Output 950 ml  Net 348.74 ml   Filed Weights   12/07/18 1213  Weight: 56.7 kg   Weight change:  Body mass index is 20.8 kg/m.   Physical Exam: General exam: Appears calm and comfortable.  Skin: No rashes,  lesions or ulcers. HEENT: Atraumatic, normocephalic, supple neck, no obvious bleeding Lungs: Clear to auscultation bilaterally CVS: Regular rate and rhythm, no murmur GI/Abd soft, nondistended, nontender, no CVA tenderness today. CNS: Alert, awake, oriented x3 Psychiatry: Mood appropriate Extremities: No pedal edema, no calf tenderness  Data Review: I have personally reviewed the laboratory data and studies available.  Recent Labs  Lab 12/07/18 1206 12/08/18 0651  WBC 20.2* 17.7*  NEUTROABS 16.1*  --   HGB 12.0 11.1*  HCT 36.3 32.1*  MCV 93.1 89.9  PLT 245 204   Recent Labs  Lab 12/07/18 1206 12/08/18 0651  NA 132* 132*  K 3.1* 2.7*  CL 99 99  CO2 21* 24  GLUCOSE 136* 114*  BUN 21 14  CREATININE 0.59 0.51  CALCIUM 8.5* 8.3*    Terrilee Croak, MD  [facility] 12/08/2018

## 2018-12-09 LAB — CBC WITH DIFFERENTIAL/PLATELET
Abs Immature Granulocytes: 0.2 10*3/uL — ABNORMAL HIGH (ref 0.00–0.07)
Basophils Absolute: 0 10*3/uL (ref 0.0–0.1)
Basophils Relative: 0 %
Eosinophils Absolute: 0 10*3/uL (ref 0.0–0.5)
Eosinophils Relative: 0 %
HCT: 34.9 % — ABNORMAL LOW (ref 36.0–46.0)
Hemoglobin: 11.4 g/dL — ABNORMAL LOW (ref 12.0–15.0)
Immature Granulocytes: 1 %
Lymphocytes Relative: 5 %
Lymphs Abs: 0.9 10*3/uL (ref 0.7–4.0)
MCH: 30.8 pg (ref 26.0–34.0)
MCHC: 32.7 g/dL (ref 30.0–36.0)
MCV: 94.3 fL (ref 80.0–100.0)
Monocytes Absolute: 1.8 10*3/uL — ABNORMAL HIGH (ref 0.1–1.0)
Monocytes Relative: 10 %
Neutro Abs: 14.8 10*3/uL — ABNORMAL HIGH (ref 1.7–7.7)
Neutrophils Relative %: 84 %
Platelets: 204 10*3/uL (ref 150–400)
RBC: 3.7 MIL/uL — ABNORMAL LOW (ref 3.87–5.11)
RDW: 13.6 % (ref 11.5–15.5)
WBC: 17.7 10*3/uL — ABNORMAL HIGH (ref 4.0–10.5)
nRBC: 0 % (ref 0.0–0.2)

## 2018-12-09 LAB — BASIC METABOLIC PANEL
Anion gap: 10 (ref 5–15)
BUN: 14 mg/dL (ref 8–23)
CO2: 23 mmol/L (ref 22–32)
Calcium: 8.6 mg/dL — ABNORMAL LOW (ref 8.9–10.3)
Chloride: 101 mmol/L (ref 98–111)
Creatinine, Ser: 0.49 mg/dL (ref 0.44–1.00)
GFR calc Af Amer: >60 mL/min (ref >60)
GFR calc non Af Amer: >60 mL/min (ref >60)
Glucose, Bld: 100 mg/dL — ABNORMAL HIGH (ref 70–99)
Potassium: 4.1 mmol/L (ref 3.5–5.1)
Sodium: 134 mmol/L — ABNORMAL LOW (ref 135–145)

## 2018-12-09 LAB — PROTIME-INR
INR: 2.5 — ABNORMAL HIGH (ref 0.8–1.2)
Prothrombin Time: 26.3 seconds — ABNORMAL HIGH (ref 11.4–15.2)

## 2018-12-09 MED ORDER — WARFARIN SODIUM 2.5 MG PO TABS
2.5000 mg | ORAL_TABLET | Freq: Once | ORAL | Status: AC
  Administered 2018-12-09: 2.5 mg via ORAL
  Filled 2018-12-09: qty 1

## 2018-12-09 MED ORDER — LOSARTAN POTASSIUM 50 MG PO TABS
50.0000 mg | ORAL_TABLET | Freq: Every day | ORAL | Status: DC
  Administered 2018-12-09 – 2018-12-11 (×3): 50 mg via ORAL
  Filled 2018-12-09 (×4): qty 1

## 2018-12-09 MED ORDER — SODIUM CHLORIDE 0.9 % IV SOLN
INTRAVENOUS | Status: DC | PRN
  Administered 2018-12-09 – 2018-12-11 (×3): 500 mL via INTRAVENOUS

## 2018-12-09 MED ORDER — HYDROCHLOROTHIAZIDE 25 MG PO TABS
25.0000 mg | ORAL_TABLET | Freq: Every day | ORAL | Status: DC
  Administered 2018-12-09 – 2018-12-12 (×4): 25 mg via ORAL
  Filled 2018-12-09 (×4): qty 1

## 2018-12-09 MED ORDER — SACCHAROMYCES BOULARDII 250 MG PO CAPS
250.0000 mg | ORAL_CAPSULE | Freq: Two times a day (BID) | ORAL | Status: DC
  Administered 2018-12-09 – 2018-12-12 (×4): 250 mg via ORAL
  Filled 2018-12-09 (×8): qty 1

## 2018-12-09 NOTE — Progress Notes (Signed)
PROGRESS NOTE  Cynthia Simon  DOB: 17-Aug-1930  PCP: Rusty Aus, MD JIR:[id number]  DOA: 12/07/2018  LOS: 2 days   Brief narrative: Cynthia Simon is a 85 y.o.f with PMH of HTN, MVP, A. fib on anticoagulation, CAD s/p stent. Patient presented to the ED on 12/06/2020 from home with complaint of generalized fatigue, lower back pain. In the ED, she had a temperature of 101.7, otherwise hemodynamically stable. Work-up showed WBC count elevated to 20.2, lactic acid was elevated to 2.4. Urinalysis showed hazy yellow urine with small leukocytes, positive nitrite and many bacteria. She was admitted under hospitalist service for sepsis secondary to UTI.  Subjective: Patient was seen and examined this morning.  Pleasant elderly Caucasian female.  Sitting up in chair.  Not in distress.  Feels better.  Daughter at bedside.    Assessment/Plan: Sepsis secondary to UTI -Urinalysis showed hazy yellow urine with small leukocytes, positive nitrite and many bacteria. -Met sepsis criteria on admission. -Currently on IV Rocephin. Pending urine culture report. -Lactic acid level improved with IV hydration. -Clinically feels better.  Hypokalemia. -Potassium level was was 2.7.  Improved with replacement, 4.1 today.    Cardiovascular issues: HTN/ MVP/ A. Fib/ CAD s/p stent. -Home meds include HCTZ, Imdur, losartan, metoprolol, simvastatin, Coumadin. -Resumed metoprolol, Imdur, simvastatin and Coumadin.  HCTZ and losartan on hold. -Creatinine has improved.  Resume HCTZ and losartan. -Pharmacy to dose Coumadin to keep INR between 2-3.  GERD -Continuing PPI  Mild diarrhea -Patient states she gets diarrhea after antibiotic use and improved with probiotics.  Started on [employer].  Body mass index is 20.8 kg/m. Mobility: Reports independent ambulation at home. Encourage ambulation. DVT prophylaxis:  Coumadin Code Status:   Code Status: DNR  Family Communication:  Expected Discharge:   Hopefully home in 1 to 2 days.  Consultants:  None  Procedures:  None  Antimicrobials: Anti-infectives (From admission, onward)   Start     Dose/Rate Route Frequency Ordered Stop   12/07/18 2200  cefTRIAXone (ROCEPHIN) 1 g in sodium chloride 0.9 % 100 mL IVPB     1 g 200 mL/hr over 30 Minutes Intravenous Every 24 hours 12/07/18 1503     12/07/18 1345  ceFEPIme (MAXIPIME) 2 g in sodium chloride 0.9 % 100 mL IVPB     2 g 200 mL/hr over 30 Minutes Intravenous  Once 12/07/18 1330 12/07/18 1436   12/07/18 1345  vancomycin (VANCOCIN) IVPB 1000 mg/200 mL premix  Status:  Discontinued     1,000 mg 200 mL/hr over 60 Minutes Intravenous  Once 12/07/18 1330 12/07/18 1347      Diet Order            Diet regular Room service appropriate? Yes; Fluid consistency: Thin  Diet effective now              Infusions:  . cefTRIAXone (ROCEPHIN)  IV Stopped (12/08/18 2235)  . lactated ringers 75 mL/hr at 12/09/18 0408    Scheduled Meds: . aspirin EC  81 mg Oral Daily  . famotidine  20 mg Oral QHS  . isosorbide mononitrate  30 mg Oral Daily  . metoprolol tartrate  12.5 mg Oral BID  . pantoprazole  40 mg Oral Daily  . simvastatin  10 mg Oral QHS  . traMADol  50 mg Oral Daily  . warfarin  2.5 mg Oral ONCE-1800  . Warfarin - Pharmacist Dosing Inpatient   Does not apply q1800    PRN meds: acetaminophen **OR** acetaminophen, loratadine,  ondansetron **OR** ondansetron (ZOFRAN) IV   Objective: Vitals:   12/09/18 0406 12/09/18 1225  BP: (!) 161/74 (!) 152/58  Pulse: 79 88  Resp: 18 16  Temp: 98.6 F (37 C) 99.8 F (37.7 C)  SpO2: 100% 94%    Intake/Output Summary (Last 24 hours) at 12/09/2018 1329 Last data filed at 12/09/2018 0900 Gross per 24 hour  Intake 2375.66 ml  Output 802 ml  Net 1573.66 ml   Filed Weights   12/07/18 1213  Weight: 56.7 kg   Weight change:  Body mass index is 20.8 kg/m.   Physical Exam: General exam: Appears calm and comfortable.  Not in  distress. Skin: No rashes, lesions or ulcers. HEENT: Atraumatic, normocephalic, supple neck, no obvious bleeding Lungs: Clear to auscultation bilaterally CVS: Regular rate and rhythm, no murmur GI/Abd soft, nondistended, nontender, no CVA tenderness today. CNS: Alert, awake, oriented x3 Psychiatry: Mood appropriate Extremities: No pedal edema, no calf tenderness  Data Review: I have personally reviewed the laboratory data and studies available.  Recent Labs  Lab 12/07/18 1206 12/08/18 0651 12/09/18 0831  WBC 20.2* 17.7* 17.7*  NEUTROABS 16.1*  --  14.8*  HGB 12.0 11.1* 11.4*  HCT 36.3 32.1* 34.9*  MCV 93.1 89.9 94.3  PLT 245 204 204   Recent Labs  Lab 12/07/18 1206 12/08/18 0651 12/09/18 0831  NA 132* 132* 134*  K 3.1* 2.7* 4.1  CL 99 99 101  CO2 21* 24 23  GLUCOSE 136* 114* 100*  BUN 21 14 14   CREATININE 0.59 0.51 0.49  CALCIUM 8.5* 8.3* 8.6*    Terrilee Croak, MD  [facility] 12/09/2018

## 2018-12-09 NOTE — Evaluation (Signed)
Physical Therapy Evaluation Patient Details Name: Cynthia Simon MRN: [id number] DOB: 1930-10-15 Today's Date: 12/09/2018   History of Present Illness  Pt is an 85 y.o. female presenting to hospital 12/07/18 with weakness, generalized fatigue, nausea, and mild SOB; febrile to 101.7 in ED.  Pt admitted with sepsis d/t UTI, hypokalemia, and htn.  PMH includes CAD, a-fib, HOH, MVP, MI, h/o L breast surgery, macular degeneration.  Clinical Impression  Prior to hospital admission, pt was independent with ambulation.  Pt and her daughter live together; pt stays on main floor; daughter is retired and can assist as needed.  Currently pt is modified independent semi-supine to sit; CGA with transfers; and CGA with ambulation around nursing loop (no AD).  Beginning of ambulation pt appearing more cautious but improved gait technique noted during middle of ambulation; pt noted to be unsteady end of ambulation (therapist provided CGA for safety) but pt able to self correct.  Limited distance ambulating d/t pt fatigue/generalized weakness.  Pt would benefit from skilled PT to address noted impairments and functional limitations including increasing endurance/activity tolerance, strengthening, and balance (see below for any additional details).  Upon hospital discharge, anticipate pt would benefit from HHPT and initial 24/7 assist with functional mobility for safety (pt's daughter reports she is retired and can assist pt).    Follow Up Recommendations Home health PT    Equipment Recommendations  None recommended by PT    Recommendations for Other Services       Precautions / Restrictions Precautions Precautions: Fall Restrictions Weight Bearing Restrictions: No      Mobility  Bed Mobility Overal bed mobility: Modified Independent             General bed mobility comments: Semi-supine to sit with HOB elevated  Transfers Overall transfer level: Needs assistance Equipment used:  None Transfers: Sit to/from Stand Sit to Stand: Min guard         General transfer comment: fairly strong stand from bed but reached for IV pole once standing; controlled descent sitting in recliner  Ambulation/Gait Ambulation/Gait assistance: Min guard Gait Distance (Feet): 200 Feet Assistive device: None   Gait velocity: decreased   General Gait Details: initially mild narrow BOS with decreased B step length first 40 feet of ambulation but improved step length and BOS next 100 feet of ambulation; pt demonstrating mild imbalance last 60 feet of ambulation with altered stepping pattern (CGA for safety; pt reporting d/t fatigue)  Stairs            Wheelchair Mobility    Modified Rankin (Stroke Patients Only)       Balance Overall balance assessment: Needs assistance Sitting-balance support: No upper extremity supported;Feet supported Sitting balance-Leahy Scale: Normal Sitting balance - Comments: steady sitting reaching outside BOS   Standing balance support: No upper extremity supported Standing balance-Leahy Scale: Good Standing balance comment: steady standing reaching within BOS                             Pertinent Vitals/Pain Pain Assessment: No/denies pain  O2 sats 96% or greater on room air during session's activities. HR 90-114 bpm during session's activities (a-fib rhythm noted).    Home Living Family/patient expects to be discharged to:: Private residence Living Arrangements: Children(pt's daughter) Available Help at Discharge: Family;Available 24 hours/day(Daughter retired) Type of Home: House Home Access: Stairs to enter Entrance Stairs-Rails: None Secretary/administrator of Steps: 2 Home Layout: Two level;Able to live on  main level with bedroom/bathroom Home Equipment: Grab bars - tub/shower      Prior Function Level of Independence: Independent         Comments: Pt reports no recent falls.     Hand Dominance         Extremity/Trunk Assessment   Upper Extremity Assessment Upper Extremity Assessment: Generalized weakness    Lower Extremity Assessment Lower Extremity Assessment: Generalized weakness    Cervical / Trunk Assessment Cervical / Trunk Assessment: Normal  Communication   Communication: HOH  Cognition Arousal/Alertness: Awake/alert Behavior During Therapy: WFL for tasks assessed/performed Overall Cognitive Status: Within Functional Limits for tasks assessed                                        General Comments General comments (skin integrity, edema, etc.): bruising noted B UE's (pt reports she bruises easily).  Nursing cleared pt for participation in physical therapy.  Pt agreeable to PT session.  Pt's daughter present during session.    Exercises  Ambulation.  Pt educated to ambulate 3x's per day with staff assist during hospitalization.   Assessment/Plan    PT Assessment Patient needs continued PT services  PT Problem List Decreased strength;Decreased activity tolerance;Decreased balance;Decreased mobility;Decreased knowledge of use of DME;Decreased knowledge of precautions       PT Treatment Interventions DME instruction;Gait training;Stair training;Functional mobility training;Therapeutic activities;Therapeutic exercise;Balance training;Patient/family education    PT Goals (Current goals can be found in the Care Plan section)  Acute Rehab PT Goals Patient Stated Goal: to improve activity tolerance PT Goal Formulation: With patient Time For Goal Achievement: 12/23/18 Potential to Achieve Goals: Good    Frequency Min 2X/week   Barriers to discharge        Co-evaluation               AM-PAC PT "6 Clicks" Mobility  Outcome Measure Help needed turning from your back to your side while in a flat bed without using bedrails?: None Help needed moving from lying on your back to sitting on the side of a flat bed without using bedrails?: None Help  needed moving to and from a bed to a chair (including a wheelchair)?: A Little Help needed standing up from a chair using your arms (e.g., wheelchair or bedside chair)?: A Little Help needed to walk in hospital room?: A Little Help needed climbing 3-5 steps with a railing? : A Little 6 Click Score: 20    End of Session Equipment Utilized During Treatment: Gait belt Activity Tolerance: Patient limited by fatigue Patient left: in chair;with call bell/phone within reach;with chair alarm set;with family/visitor present Nurse Communication: Mobility status;Precautions;Other (comment)(via white board) PT Visit Diagnosis: Unsteadiness on feet (R26.81);Muscle weakness (generalized) (M62.81);Difficulty in walking, not elsewhere classified (R26.2)    Time: 3220-2542 PT Time Calculation (min) (ACUTE ONLY): 25 min   Charges:   PT Evaluation $PT Eval Low Complexity: 1 Low PT Treatments $Therapeutic Exercise: 8-22 mins       Leitha Bleak, PT 12/09/18, 12:01 PM [phone]

## 2018-12-09 NOTE — Progress Notes (Addendum)
[facility] for Warfarin  Indication: atrial fibrillation  Allergies  Allergen Reactions  . Amlodipine Besylate Swelling    In feet and ankles    Patient Measurements: Height: 5\' 5"  (165.1 cm) Weight: 125 lb (56.7 kg) IBW/kg (Calculated) : 57  Vital Signs: Temp: 98.6 F (37 C) (11/09 0406) Temp Source: Oral (11/09 0406) BP: 161/74 (11/09 0406) Pulse Rate: 79 (11/09 0406)  Labs: Recent Labs    12/07/18 1206 12/08/18 0651 12/09/18 0441  HGB 12.0 11.1*  --   HCT 36.3 32.1*  --   PLT 245 204  --   LABPROT 23.2* 26.7* 26.3*  INR 2.1* 2.5* 2.5*  CREATININE 0.59 0.51  --   TROPONINIHS 30*  --   --     Estimated Creatinine Clearance: 46 mL/min (by C-G formula based on SCr of 0.51 mg/dL).   Medical History: Past Medical History:  Diagnosis Date  . Arthritis   . Carotid artery disease ([place])    BLOCKAGE  . Coronary artery disease   . Dysrhythmia    AFIB  . HOH (hard of hearing)   . Hypertension   . MVP (mitral valve prolapse)   . Myocardial infarction ([place])    MILD  . Palpitations     Medications:  Medications Prior to Admission  Medication Sig Dispense Refill Last Dose  . Aflibercept 2 MG/0.05ML SOLN Apply 0.05 mLs to eye every 30 (thirty) days.   Past Month at Unknown time  . aspirin EC 81 MG tablet Take 81 mg by mouth daily.   12/07/2018 at 1000  . famotidine (PEPCID) 20 MG tablet Take 20 mg by mouth at bedtime.   12/06/2018 at Unknown time  . hydrochlorothiazide (HYDRODIURIL) 25 MG tablet Take 25 mg by mouth daily.   12/07/2018 at 1000  . isosorbide mononitrate (IMDUR) 30 MG 24 hr tablet Take 30 mg by mouth daily.   12/07/2018 at 1000  . loratadine (CLARITIN) 10 MG tablet Take 10 mg by mouth daily as needed for allergies.    prn at prn  . losartan (COZAAR) 50 MG tablet Take 50 mg by mouth daily.   12/06/2018 at Unknown time  . metoprolol tartrate (LOPRESSOR) 25 MG tablet Take 12.5 mg by mouth 2 (two) times daily.    12/07/2018  at 1000  . nitroGLYCERIN (NITROSTAT) 0.4 MG SL tablet Place 0.4 mg under the tongue every 5 (five) minutes as needed for chest pain.   prn at prn  . pantoprazole (PROTONIX) 40 MG tablet Take 40 mg by mouth daily.   12/07/2018 at 1000  . simvastatin (ZOCOR) 10 MG tablet Take 10 mg by mouth at bedtime.    12/06/2018 at Unknown time  . traMADol (ULTRAM) 50 MG tablet Take 50 mg by mouth daily.    12/07/2018 at 1000  . warfarin (COUMADIN) 5 MG tablet Take 2.5-5 mg by mouth See admin instructions. Warfarin 5 mg daily at bedtime alternating with warfarin 2.5 mg daily at bedtime   12/06/2018 at Unknown time    Assessment: Pharmacy consulted to dose warfarin in this 85 year old female admitted with Afib.  On warfarin PTA.  Pt takes warfarin 5 mg PO QOD alternating with warfarin 2.5 mg PO QOD.   Last dose was 5 mg on 11/6. 11/7: INR = 2.1  Today's INR: INR is therapeutic on lower dose of warfarin.  Date INR Warfarin Dose  11/7 2.1 2.5 mg  11/8 2.5 2.5 mg  11/9 2.5  Goal of Therapy:  INR 2-3   Plan:  Will order warfarin 2.5 mg. Daily INR ordered. CBC q3 days (next CBC due on 11/11).   Katha Cabal, PharmD 12/09/2018,7:38 AM

## 2018-12-10 LAB — BLOOD CULTURE ID PANEL (REFLEXED)

## 2018-12-10 LAB — PROTIME-INR
INR: 2.3 — ABNORMAL HIGH (ref 0.8–1.2)
Prothrombin Time: 24.9 seconds — ABNORMAL HIGH (ref 11.4–15.2)

## 2018-12-10 LAB — URINE CULTURE: Culture: <10000 — AB

## 2018-12-10 MED ORDER — SODIUM CHLORIDE 0.9 % IV SOLN
1.0000 g | Freq: Two times a day (BID) | INTRAVENOUS | Status: DC
  Administered 2018-12-10 – 2018-12-12 (×5): 1 g via INTRAVENOUS
  Filled 2018-12-10 (×9): qty 1

## 2018-12-10 MED ORDER — WARFARIN SODIUM 5 MG PO TABS
5.0000 mg | ORAL_TABLET | Freq: Once | ORAL | Status: AC
  Administered 2018-12-10: 5 mg via ORAL
  Filled 2018-12-10: qty 1

## 2018-12-10 NOTE — Progress Notes (Signed)
PT Cancellation Note  Patient Details Name: Cynthia Simon MRN: [id number] DOB: September 24, 1930   Cancelled Treatment:    Reason Eval/Treat Not Completed: Other (comment).  Staff present with pt upon PT arrival and pt not available for PT session.  Will re-attempt PT session at a later date/time   Leitha Bleak 12/10/2018, 10:28 AM

## 2018-12-10 NOTE — Progress Notes (Addendum)
PROGRESS NOTE  Cynthia Simon  DOB: [birth year]  PCP: Rusty Aus, MD QIH:[id number]  DOA: 12/07/2018  LOS: 3 days   Brief narrative: Cynthia Simon is a 85 y.o.f with PMH of HTN, MVP, A. fib on anticoagulation, CAD s/p stent. Patient presented to the ED on 12/06/2020 from home with complaint of generalized fatigue, lower back pain. In the ED, she had a temperature of 101.7, otherwise hemodynamically stable. Work-up showed WBC count elevated to 20.2, lactic acid was elevated to 2.4. Urinalysis showed hazy yellow urine with small leukocytes, positive nitrite and many bacteria. She was admitted under hospitalist service for sepsis secondary to UTI.  Subjective: Patient was seen and examined this morning.  Pleasant elderly Caucasian female.  Sitting up in chair.  Not in distress.  Feels better.  Daughter at bedside.   Patient is anticipating discharge today.  Unable to discharge because of blood culture turning back positive.   Assessment/Plan: Sepsis secondary to UTI - POA Bacteremia with Enterobacteriaceae and E. Coli -Urinalysis on admission showed hazy yellow urine with small leukocytes, positive nitrite and many bacteria. -Met sepsis criteria on admission. -Improved with IV antibiotics and hydration. -Urine culture report pending.  For some reason, it was not from the sample abdominal admission and was only sent 2 days later.  Will be surprised if urine culture did not show any growth.  Blood culture obtained on admission is already showing Enterobacteriaceae and E. Coli, urine itself likely to be the source. -Per discussion with pharmacy today, antibiotics were broadened to IV meropenem.  Hypokalemia. -Potassium level was was 2.7.  Improved with replacement, 4.1 today.    Cardiovascular issues: HTN/ MVP/ A. Fib/ CAD s/p stent. -Home meds include metoprolol, Imdur, simvastatin, HCTZ, losartan and Coumadin.  -Resumed all. -Pharmacy to dose Coumadin to keep INR between  2-3.  GERD -Continuing PPI  Mild diarrhea -Patient states she gets diarrhea after antibiotic use and improved with probiotics.  Started on [employer].  Diarrhea improved today.  Body mass index is 20.8 kg/m. Mobility: Reports independent ambulation at home. Encourage ambulation. DVT prophylaxis:  Coumadin Code Status:   Code Status: DNR  Family Communication:  Expected Discharge:  Unable to discharge today because of blood culture turning back positive.  Pending sensitivity report.  Consultants:  None  Procedures:  None  Antimicrobials: Anti-infectives (From admission, onward)   Start     Dose/Rate Route Frequency Ordered Stop   12/10/18 0900  meropenem (MERREM) 1 g in sodium chloride 0.9 % 100 mL IVPB     1 g 200 mL/hr over 30 Minutes Intravenous Every 12 hours 12/10/18 0819     12/07/18 2200  cefTRIAXone (ROCEPHIN) 1 g in sodium chloride 0.9 % 100 mL IVPB  Status:  Discontinued     1 g 200 mL/hr over 30 Minutes Intravenous Every 24 hours 12/07/18 1503 12/10/18 0819   12/07/18 1345  ceFEPIme (MAXIPIME) 2 g in sodium chloride 0.9 % 100 mL IVPB     2 g 200 mL/hr over 30 Minutes Intravenous  Once 12/07/18 1330 12/07/18 1436   12/07/18 1345  vancomycin (VANCOCIN) IVPB 1000 mg/200 mL premix  Status:  Discontinued     1,000 mg 200 mL/hr over 60 Minutes Intravenous  Once 12/07/18 1330 12/07/18 1347      Diet Order            Diet regular Room service appropriate? Yes; Fluid consistency: Thin  Diet effective now  Infusions:  . sodium chloride 10 mL/hr at 12/10/18 0300  . lactated ringers 75 mL/hr at 12/10/18 0300  . meropenem (MERREM) IV 1 g (12/10/18 1254)    Scheduled Meds: . aspirin EC  81 mg Oral Daily  . famotidine  20 mg Oral QHS  . hydrochlorothiazide  25 mg Oral Daily  . isosorbide mononitrate  30 mg Oral Daily  . losartan  50 mg Oral Daily  . metoprolol tartrate  12.5 mg Oral BID  . pantoprazole  40 mg Oral Daily  . saccharomyces  boulardii  250 mg Oral BID  . simvastatin  10 mg Oral QHS  . traMADol  50 mg Oral Daily  . warfarin  5 mg Oral ONCE-1800  . Warfarin - Pharmacist Dosing Inpatient   Does not apply q1800    PRN meds: sodium chloride, acetaminophen **OR** acetaminophen, loratadine, ondansetron **OR** ondansetron (ZOFRAN) IV   Objective: Vitals:   12/10/18 1016 12/10/18 1312  BP: 111/68 (!) 136/49  Pulse: 81 84  Resp: 20 17  Temp: 98.5 F (36.9 C) 98.3 F (36.8 C)  SpO2: 98% 99%    Intake/Output Summary (Last 24 hours) at 12/10/2018 1347 Last data filed at 12/10/2018 1008 Gross per 24 hour  Intake 2443.8 ml  Output 1500 ml  Net 943.8 ml   Filed Weights   12/07/18 1213  Weight: 56.7 kg   Weight change:  Body mass index is 20.8 kg/m.   Physical Exam: General exam: Appears calm and comfortable.  Not in distress.  Sitting up in chair.  Thin built. Skin: No rashes, lesions or ulcers. HEENT: Atraumatic, normocephalic, supple neck, no obvious bleeding Lungs: Clear to auscultation bilaterally CVS: Regular rate and rhythm, no murmur GI/Abd soft, nondistended, nontender, no CVA tenderness CNS: Alert, awake, oriented x3 Psychiatry: Mood appropriate Extremities: No pedal edema, no calf tenderness  Data Review: I have personally reviewed the laboratory data and studies available.  Recent Labs  Lab 12/07/18 1206 12/08/18 0651 12/09/18 0831  WBC 20.2* 17.7* 17.7*  NEUTROABS 16.1*  --  14.8*  HGB 12.0 11.1* 11.4*  HCT 36.3 32.1* 34.9*  MCV 93.1 89.9 94.3  PLT 245 204 204   Recent Labs  Lab 12/07/18 1206 12/08/18 0651 12/09/18 0831  NA 132* 132* 134*  K 3.1* 2.7* 4.1  CL 99 99 101  CO2 21* 24 23  GLUCOSE 136* 114* 100*  BUN _0 CREATININE 0.59 0.51 0.49  CALCIUM 8.5* 8.3* 8.6*    Terrilee Croak, MD  [facility] 12/10/2018

## 2018-12-10 NOTE — Progress Notes (Signed)
PHARMACY - PHYSICIAN COMMUNICATION CRITICAL VALUE ALERT - BLOOD CULTURE IDENTIFICATION (BCID)  Cynthia Simon is an 85 y.o. female who presented to [facility] on 12/07/2018 with a chief complaint of sepsis 2/2 to UTI.   Assessment:  1/4 GNR enterobacteriaceae, E.Coli, KPC not detected  Name of physician (or Provider) Contacted: Dr. Pietro Cassis  Current antibiotics: Ceftriaxone   Changes to prescribed antibiotics recommended:  Stop CTX and start meropenem 1g Q12H (CrCrl 30-50 mL/min).  Results for orders placed or performed during the hospital encounter of 12/07/18  Blood Culture ID Panel (Reflexed) (Collected: 12/07/2018 12:22 PM)  Result Value Ref Range   Enterococcus species NOT DETECTED NOT DETECTED   Listeria monocytogenes NOT DETECTED NOT DETECTED   Staphylococcus species NOT DETECTED NOT DETECTED   Staphylococcus aureus (BCID) NOT DETECTED NOT DETECTED   Streptococcus species NOT DETECTED NOT DETECTED   Streptococcus agalactiae NOT DETECTED NOT DETECTED   Streptococcus pneumoniae NOT DETECTED NOT DETECTED   Streptococcus pyogenes NOT DETECTED NOT DETECTED   Acinetobacter baumannii NOT DETECTED NOT DETECTED   Enterobacteriaceae species DETECTED (A) NOT DETECTED   Enterobacter cloacae complex NOT DETECTED NOT DETECTED   Escherichia coli DETECTED (A) NOT DETECTED   Klebsiella oxytoca NOT DETECTED NOT DETECTED   Klebsiella pneumoniae NOT DETECTED NOT DETECTED   Proteus species NOT DETECTED NOT DETECTED   Serratia marcescens NOT DETECTED NOT DETECTED   Carbapenem resistance NOT DETECTED NOT DETECTED   Haemophilus influenzae NOT DETECTED NOT DETECTED   Neisseria meningitidis NOT DETECTED NOT DETECTED   Pseudomonas aeruginosa NOT DETECTED NOT DETECTED   Candida albicans NOT DETECTED NOT DETECTED   Candida glabrata NOT DETECTED NOT DETECTED   Candida krusei NOT DETECTED NOT DETECTED   Candida parapsilosis NOT DETECTED NOT DETECTED   Candida tropicalis NOT DETECTED NOT DETECTED     Mollyann Halbert R Lamira Borin 12/10/2018  8:04 AM

## 2018-12-10 NOTE — Progress Notes (Signed)
Cynthia Simon for Warfarin  Indication: atrial fibrillation  Allergies  Allergen Reactions  . Amlodipine Besylate Swelling    In feet and ankles    Patient Measurements: Height: 5\' 5"  (165.1 cm) Weight: 125 lb (56.7 kg) IBW/kg (Calculated) : 57  Vital Signs: Temp: 97.7 F (36.5 C) (11/10 0316) Temp Source: Oral (11/10 0316) BP: 175/73 (11/10 0316) Pulse Rate: 79 (11/10 0316)  Labs: Recent Labs    12/07/18 1206 12/08/18 0651 12/09/18 0441 12/09/18 0831 12/10/18 0432  HGB 12.0 11.1*  --  11.4*  --   HCT 36.3 32.1*  --  34.9*  --   PLT 245 204  --  204  --   LABPROT 23.2* 26.7* 26.3*  --  24.9*  INR 2.1* 2.5* 2.5*  --  2.3*  CREATININE 0.59 0.51  --  0.49  --   TROPONINIHS 30*  --   --   --   --     Estimated Creatinine Clearance: 46 mL/min (by C-G formula based on SCr of 0.49 mg/dL).   Medical History: Past Medical History:  Diagnosis Date  . Arthritis   . Carotid artery disease ([place])    BLOCKAGE  . Coronary artery disease   . Dysrhythmia    AFIB  . HOH (hard of hearing)   . Hypertension   . MVP (mitral valve prolapse)   . Myocardial infarction ([place])    MILD  . Palpitations     Medications:  Medications Prior to Admission  Medication Sig Dispense Refill Last Dose  . Aflibercept 2 MG/0.05ML SOLN Apply 0.05 mLs to eye every 30 (thirty) days.   Past Month at Unknown time  . aspirin EC 81 MG tablet Take 81 mg by mouth daily.   12/07/2018 at 1000  . famotidine (PEPCID) 20 MG tablet Take 20 mg by mouth at bedtime.   12/06/2018 at Unknown time  . hydrochlorothiazide (HYDRODIURIL) 25 MG tablet Take 25 mg by mouth daily.   12/07/2018 at 1000  . isosorbide mononitrate (IMDUR) 30 MG 24 hr tablet Take 30 mg by mouth daily.   12/07/2018 at 1000  . loratadine (CLARITIN) 10 MG tablet Take 10 mg by mouth daily as needed for allergies.    prn at prn  . losartan (COZAAR) 50 MG tablet Take 50 mg by mouth daily.   12/06/2018 at Unknown time   . metoprolol tartrate (LOPRESSOR) 25 MG tablet Take 12.5 mg by mouth 2 (two) times daily.    12/07/2018 at 1000  . nitroGLYCERIN (NITROSTAT) 0.4 MG SL tablet Place 0.4 mg under the tongue every 5 (five) minutes as needed for chest pain.   prn at prn  . pantoprazole (PROTONIX) 40 MG tablet Take 40 mg by mouth daily.   12/07/2018 at 1000  . simvastatin (ZOCOR) 10 MG tablet Take 10 mg by mouth at bedtime.    12/06/2018 at Unknown time  . traMADol (ULTRAM) 50 MG tablet Take 50 mg by mouth daily.    12/07/2018 at 1000  . warfarin (COUMADIN) 5 MG tablet Take 2.5-5 mg by mouth See admin instructions. Warfarin 5 mg daily at bedtime alternating with warfarin 2.5 mg daily at bedtime   12/06/2018 at Unknown time    Assessment: Pharmacy consulted to dose warfarin in this 85 year old female admitted with Afib.  On warfarin PTA.  Pt takes warfarin 5 mg PO QOD alternating with warfarin 2.5 mg PO QOD.   Last dose was 5 mg on 11/6.  11/7: INR = 2.1  Today's INR: INR is therapeutic on lower dose of warfarin.  Date INR Warfarin Dose  11/7 2.1 2.5 mg  11/8 2.5 2.5 mg  11/9 2.5 2.5 mg   11/10 2.3             Goal of Therapy:  INR 2-3   Plan:  Will order warfarin 5 mg. Daily INR ordered. CBC q3 days (next CBC due on 11/11).   Katha Cabal, PharmD 12/10/2018,7:20 AM

## 2018-12-10 NOTE — Care Management (Signed)
PT has assessed patient and recommends home health PT.  RNCM to complete assessment on 12/11/18

## 2018-12-10 NOTE — Care Management Important Message (Signed)
Important Message  Patient Details  Name: Cynthia Simon MRN: [id number] Date of Birth: [birth year]   Medicare Important Message Given:  Yes     Dannette Barbara 12/10/2018, 10:43 AM

## 2018-12-11 DIAGNOSIS — A419 Sepsis, unspecified organism: Secondary | ICD-10-CM

## 2018-12-11 DIAGNOSIS — N390 Urinary tract infection, site not specified: Secondary | ICD-10-CM

## 2018-12-11 LAB — BASIC METABOLIC PANEL
Anion gap: 7 (ref 5–15)
BUN: 19 mg/dL (ref 8–23)
CO2: 28 mmol/L (ref 22–32)
Calcium: 8.9 mg/dL (ref 8.9–10.3)
Chloride: 103 mmol/L (ref 98–111)
Creatinine, Ser: 0.55 mg/dL (ref 0.44–1.00)
GFR calc Af Amer: >60 mL/min (ref >60)
GFR calc non Af Amer: >60 mL/min (ref >60)
Glucose, Bld: 118 mg/dL — ABNORMAL HIGH (ref 70–99)
Potassium: 3.7 mmol/L (ref 3.5–5.1)
Sodium: 138 mmol/L (ref 135–145)

## 2018-12-11 LAB — CBC
HCT: 33.9 % — ABNORMAL LOW (ref 36.0–46.0)
Hemoglobin: 10.9 g/dL — ABNORMAL LOW (ref 12.0–15.0)
MCH: 30.3 pg (ref 26.0–34.0)
MCHC: 32.2 g/dL (ref 30.0–36.0)
MCV: 94.2 fL (ref 80.0–100.0)
Platelets: 219 10*3/uL (ref 150–400)
RBC: 3.6 MIL/uL — ABNORMAL LOW (ref 3.87–5.11)
RDW: 13.3 % (ref 11.5–15.5)
WBC: 11.1 10*3/uL — ABNORMAL HIGH (ref 4.0–10.5)
nRBC: 0 % (ref 0.0–0.2)

## 2018-12-11 LAB — GLUCOSE, CAPILLARY: Glucose-Capillary: 110 mg/dL — ABNORMAL HIGH (ref 70–99)

## 2018-12-11 LAB — PROTIME-INR
INR: 2.2 — ABNORMAL HIGH (ref 0.8–1.2)
Prothrombin Time: 24.2 seconds — ABNORMAL HIGH (ref 11.4–15.2)

## 2018-12-11 MED ORDER — WARFARIN SODIUM 2.5 MG PO TABS
2.5000 mg | ORAL_TABLET | Freq: Once | ORAL | Status: AC
  Administered 2018-12-11: 2.5 mg via ORAL
  Filled 2018-12-11: qty 1

## 2018-12-11 NOTE — Progress Notes (Signed)
[place] for Warfarin  Indication: atrial fibrillation  Allergies  Allergen Reactions  . Amlodipine Besylate Swelling    In feet and ankles    Patient Measurements: Height: 5\' 5"  (165.1 cm) Weight: 125 lb (56.7 kg) IBW/kg (Calculated) : 57  Vital Signs: Temp: 98.2 F (36.8 C) (11/11 0459) Temp Source: Oral (11/11 0459) BP: 169/82 (11/11 0629) Pulse Rate: 80 (11/11 0629)  Labs: Recent Labs    12/09/18 0441 12/09/18 0831 12/10/18 0432 12/11/18 0434  HGB  --  11.4*  --  10.9*  HCT  --  34.9*  --  33.9*  PLT  --  204  --  219  LABPROT 26.3*  --  24.9* 24.2*  INR 2.5*  --  2.3* 2.2*  CREATININE  --  0.49  --  0.55    Estimated Creatinine Clearance: 46 mL/min (by C-G formula based on SCr of 0.55 mg/dL).   Medical History: Past Medical History:  Diagnosis Date  . Arthritis   . Carotid artery disease ([place])    BLOCKAGE  . Coronary artery disease   . Dysrhythmia    AFIB  . HOH (hard of hearing)   . Hypertension   . MVP (mitral valve prolapse)   . Myocardial infarction ([place])    MILD  . Palpitations     Medications:  Medications Prior to Admission  Medication Sig Dispense Refill Last Dose  . Aflibercept 2 MG/0.05ML SOLN Apply 0.05 mLs to eye every 30 (thirty) days.   Past Month at Unknown time  . aspirin EC 81 MG tablet Take 81 mg by mouth daily.   12/07/2018 at 1000  . famotidine (PEPCID) 20 MG tablet Take 20 mg by mouth at bedtime.   12/06/2018 at Unknown time  . hydrochlorothiazide (HYDRODIURIL) 25 MG tablet Take 25 mg by mouth daily.   12/07/2018 at 1000  . isosorbide mononitrate (IMDUR) 30 MG 24 hr tablet Take 30 mg by mouth daily.   12/07/2018 at 1000  . loratadine (CLARITIN) 10 MG tablet Take 10 mg by mouth daily as needed for allergies.    prn at prn  . losartan (COZAAR) 50 MG tablet Take 50 mg by mouth daily.   12/06/2018 at Unknown time  . metoprolol tartrate (LOPRESSOR) 25 MG tablet Take 12.5 mg by mouth 2 (two)  times daily.    12/07/2018 at 1000  . nitroGLYCERIN (NITROSTAT) 0.4 MG SL tablet Place 0.4 mg under the tongue every 5 (five) minutes as needed for chest pain.   prn at prn  . pantoprazole (PROTONIX) 40 MG tablet Take 40 mg by mouth daily.   12/07/2018 at 1000  . simvastatin (ZOCOR) 10 MG tablet Take 10 mg by mouth at bedtime.    12/06/2018 at Unknown time  . traMADol (ULTRAM) 50 MG tablet Take 50 mg by mouth daily.    12/07/2018 at 1000  . warfarin (COUMADIN) 5 MG tablet Take 2.5-5 mg by mouth See admin instructions. Warfarin 5 mg daily at bedtime alternating with warfarin 2.5 mg daily at bedtime   12/06/2018 at Unknown time    Assessment: Pharmacy consulted to dose warfarin in this 85 year old female admitted with Afib.  On warfarin PTA.  Pt takes warfarin 5 mg PO QOD alternating with warfarin 2.5 mg PO QOD.   Last dose was 5 mg on 11/6. 11/7: INR = 2.1  Today's INR: INR is therapeutic.  Date INR Warfarin Dose  11/7 2.1 2.5 mg  11/8 2.5  2.5 mg  11/9 2.5 2.5 mg   11/10 2.3 5 mg  11/11 2.2 -------        Goal of Therapy:  INR 2-3   Plan:  Will order warfarin 2.5 mg. Daily INR ordered. CBC q3 days (next CBC due on 11/14).   Katha Cabal, PharmD 12/11/2018,7:35 AM

## 2018-12-11 NOTE — Progress Notes (Signed)
PROGRESS NOTE  Cynthia Simon JEH:[id number] DOB: September 11, 1930 DOA: 12/07/2018 PCP: Rusty Aus, MD  HPI/Recap of past 24 hours: Cynthia Simon is a 85 y.o. female with Past medical history of CAD, A. fib, HTN, chronic anticoagulation. Patient presented with complaints of generalized fatigue that started this morning.  Patient mentions that she was at her baseline when she went to sleep last night. When she woke up this morning she was severely fatigue and was unable to do any activity. She also reported left sided chest rib pain which she never had before. This pain lasted for few minutes and resolved on its own. She denies any nausea or vomiting denies any dizziness or lightheadedness. She had some chills but no fever. No diarrhea no constipation. No recent change in medications.    ED Course: Presented with above complaint.  Had leukocytosis, fever, meeting SIRS criteria.  Patient's UA was showing nitrate positive pyuria and patient was referred for admission for possible sepsis secondary to UTI.  At her baseline ambulates with assistance independent for most of her ADL;  manages her medication on her own.  12/11/18: Patient was seen and examined at her bedside this morning.  She denies dysuria, abdominal pain or nausea.  Assessment/Plan: Active Problems:   Sepsis due to urinary tract infection (HCC)  Sepsis secondary to E. coli bacteremia, poa -Presented with leukocytosis with WBC of greater than 20 K and fever with T-max of 101.7.  Urinalysis on admission showed hazy yellow urine with small leukocytes, positive nitrite and many bacteria.  Urine culture insignificant growth Blood cultures drawn on 12/07/2018 + 1 out of 2 bottles for E. coli, awaiting sensitivities Currently on meropenem Repeat blood cultures on 12/11/2018  Will de-escalate antibiotics once sensitivities resolved Monitor fever curve and WBC Sepsis physiology is resolving  Chronic A. fib on  Coumadin Rate is currently controlled on Lopressor On Coumadin for primary CVA prevention INR is therapeutic at 2.2 Coumadin is managed by pharmacy  Hyperlipidemia Continue Zocor  Resolved post repletion: Hypokalemia. -Potassium level was was 2.7. Potassium 3.7 on 12/11/18.    Cardiovascular issues: HTN/ MVP/ A. Fib/ CAD s/p stent. -Home meds include metoprolol, Imdur, simvastatin, HCTZ, losartan and Coumadin.  -Resumed all. -Pharmacy to dose Coumadin to keep INR between 2-3.  GERD Continue PPI  Resolved mild diarrhea -Patient states she gets diarrhea after antibiotic use and improved with probiotics.    Continue on Florastor.    Body mass index is 20.8 kg/m.   Mobility: Reports independent ambulation at home. Encourage ambulation. DVT prophylaxis: Coumadin Code Status:  Code Status: DNR  Family Communication: Expected Discharge:Discharge to home once sensitivities return for de-escalation of antibiotics.  Consultants:  None  Procedures:  None.  Objective: Vitals:   12/11/18 0459 12/11/18 0629 12/11/18 0820 12/11/18 1405  BP: (!) 185/72 (!) 169/82 (!) 152/77 (!) 130/56  Pulse: 77 80 92 83  Resp: 20   16  Temp: 98.2 F (36.8 C)   98 F (36.7 C)  TempSrc: Oral   Oral  SpO2: 96%   97%  Weight:      Height:        Intake/Output Summary (Last 24 hours) at 12/11/2018 1456 Last data filed at 12/10/2018 2214 Gross per 24 hour  Intake 9.99 ml  Output 0 ml  Net 9.99 ml   Filed Weights   12/07/18 1213  Weight: 56.7 kg    Exam:  . General: 85 y.o. year-old female well developed well nourished in  no acute distress.  Alert and oriented x4. . Cardiovascular: Irregular rate and rhythm with no rubs or gallops.  No thyromegaly or JVD noted.   Marland Kitchen Respiratory: Clear to auscultation with no wheezes or rales. Good inspiratory effort. . Abdomen: Soft nontender nondistended with normal bowel sounds x4 quadrants. . Musculoskeletal: Trace lower extremity  edema. 2/4 pulses in all 4 extremities. Marland Kitchen Psychiatry: Mood is appropriate for condition and setting   Data Reviewed: CBC: Recent Labs  Lab 12/07/18 1206 12/08/18 0651 12/09/18 0831 12/11/18 0434  WBC 20.2* 17.7* 17.7* 11.1*  NEUTROABS 16.1*  --  14.8*  --   HGB 12.0 11.1* 11.4* 10.9*  HCT 36.3 32.1* 34.9* 33.9*  MCV 93.1 89.9 94.3 94.2  PLT 245 204 204 219   Basic Metabolic Panel: Recent Labs  Lab 12/07/18 1206 12/08/18 0651 12/09/18 0831 12/11/18 0434  NA 132* 132* 134* 138  K 3.1* 2.7* 4.1 3.7  CL 99 99 101 103  CO2 21* GLUCOSE 136* 114* 100* 118*  BUN CREATININE 0.59 0.51 0.49 0.55  CALCIUM 8.5* 8.3* 8.6* 8.9   GFR: Estimated Creatinine Clearance: 46 mL/min (by C-G formula based on SCr of 0.55 mg/dL). Liver Function Tests: Recent Labs  Lab 12/07/18 1206 12/08/18 0651  AST 22 16  ALT 19 16  ALKPHOS 45 43  BILITOT 1.1 1.1  PROT 6.1* 5.7*  ALBUMIN 3.1* 2.7*   No results for input(s): LIPASE, AMYLASE in the last 168 hours. No results for input(s): AMMONIA in the last 168 hours. Coagulation Profile: Recent Labs  Lab 12/07/18 1206 12/08/18 0651 12/09/18 0441 12/10/18 0432 12/11/18 0434  INR 2.1* 2.5* 2.5* 2.3* 2.2*   Cardiac Enzymes: No results for input(s): CKTOTAL, CKMB, CKMBINDEX, TROPONINI in the last 168 hours. BNP (last 3 results) No results for input(s): PROBNP in the last 8760 hours. HbA1C: No results for input(s): HGBA1C in the last 72 hours. CBG: Recent Labs  Lab 12/11/18 1118  GLUCAP 110*   Lipid Profile: No results for input(s): CHOL, HDL, LDLCALC, TRIG, CHOLHDL, LDLDIRECT in the last 72 hours. Thyroid Function Tests: No results for input(s): TSH, T4TOTAL, FREET4, T3FREE, THYROIDAB in the last 72 hours. Anemia Panel: No results for input(s): VITAMINB12, FOLATE, FERRITIN, TIBC, IRON, RETICCTPCT in the last 72 hours. Urine analysis:    Component Value Date/Time   COLORURINE YELLOW (A) 12/07/2018 1222    APPEARANCEUR HAZY (A) 12/07/2018 1222   LABSPEC 1.014 12/07/2018 1222   PHURINE 7.0 12/07/2018 1222   GLUCOSEU NEGATIVE 12/07/2018 1222   HGBUR MODERATE (A) 12/07/2018 1222   BILIRUBINUR NEGATIVE 12/07/2018 1222   KETONESUR NEGATIVE 12/07/2018 1222   PROTEINUR 30 (A) 12/07/2018 1222   NITRITE POSITIVE (A) 12/07/2018 1222   LEUKOCYTESUR SMALL (A) 12/07/2018 1222   Sepsis Labs: (procalcitonin:4,lacticidven:4)  ) Recent Results (from the past 240 hour(s))  Culture, blood (routine x 2)     Status: None (Preliminary result)   Collection Time: 12/07/18 12:22 PM   Specimen: BLOOD  Result Value Ref Range Status   Specimen Description BLOOD LT FOREARM  Final   Special Requests   Final    BOTTLES DRAWN AEROBIC AND ANAEROBIC Blood Culture results may not be optimal due to an inadequate volume of blood received in culture bottles   Culture   Final    NO GROWTH 4 DAYS Performed at [facility], [address] [place], [place] [address]    Report Status PENDING  Incomplete  Culture, blood (routine x 2)     Status: Abnormal (Preliminary result)   Collection Time: 12/07/18 12:22 PM   Specimen: BLOOD  Result Value Ref Range Status   Specimen Description   Final    BLOOD RT [facility] Performed at [facility], [address] [place], [place] [address]    Special Requests   Final    BOTTLES DRAWN AEROBIC AND ANAEROBIC Blood Culture results may not be optimal due to an excessive volume of blood received in culture bottles Performed at [facility], [address] [place], [place] [address]    Culture  Setup Time   Final    GRAM NEGATIVE RODS ANAEROBIC BOTTLE ONLY CRITICAL RESULT CALLED TO, READ BACK BY AND VERIFIED WITH: Mila MerryWALID NAZARI AT [id number] 12/10/2018 SDR    Culture (A)  Final    ESCHERICHIA COLI SUSCEPTIBILITIES TO FOLLOW Performed at [facility], [address] [place], [place] [address]    Report Status PENDING  Incomplete  Blood Culture  ID Panel (Reflexed)     Status: Abnormal   Collection Time: 12/07/18 12:22 PM  Result Value Ref Range Status   Enterococcus species NOT DETECTED NOT DETECTED Final   Listeria monocytogenes NOT DETECTED NOT DETECTED Final   Staphylococcus species NOT DETECTED NOT DETECTED Final   Staphylococcus aureus (BCID) NOT DETECTED NOT DETECTED Final   Streptococcus species NOT DETECTED NOT DETECTED Final   Streptococcus agalactiae NOT DETECTED NOT DETECTED Final   Streptococcus pneumoniae NOT DETECTED NOT DETECTED Final   Streptococcus pyogenes NOT DETECTED NOT DETECTED Final   Acinetobacter baumannii NOT DETECTED NOT DETECTED Final   Enterobacteriaceae species DETECTED (A) NOT DETECTED Final    Comment: Enterobacteriaceae represent a large family of gram-negative bacteria, not a single organism. CRITICAL RESULT CALLED TO, READ BACK BY AND VERIFIED WITH:  Mila MerryWALID NAZARI AT [id number] 12/10/2018 SDR    Enterobacter cloacae complex NOT DETECTED NOT DETECTED Final   Escherichia coli DETECTED (A) NOT DETECTED Final    Comment: CRITICAL RESULT CALLED TO, READ BACK BY AND VERIFIED WITH:  Mila MerryWALID NAZARI AT [id number] 12/10/2018 SDR    Klebsiella oxytoca NOT DETECTED NOT DETECTED Final   Klebsiella pneumoniae NOT DETECTED NOT DETECTED Final   Proteus species NOT DETECTED NOT DETECTED Final   Serratia marcescens NOT DETECTED NOT DETECTED Final   Carbapenem resistance NOT DETECTED NOT DETECTED Final   Haemophilus influenzae NOT DETECTED NOT DETECTED Final   Neisseria meningitidis NOT DETECTED NOT DETECTED Final   Pseudomonas aeruginosa NOT DETECTED NOT DETECTED Final   Candida albicans NOT DETECTED NOT DETECTED Final   Candida glabrata NOT DETECTED NOT DETECTED Final   Candida krusei NOT DETECTED NOT DETECTED Final   Candida parapsilosis NOT DETECTED NOT DETECTED Final   Candida tropicalis NOT DETECTED NOT DETECTED Final    Comment: Performed at [facility], [address] [place], [place] [address]  SARS  CORONAVIRUS 2 (TAT 6-24 HRS) Nasopharyngeal Nasopharyngeal Swab     Status: None   Collection Time: 12/07/18 12:58 PM   Specimen: Nasopharyngeal Swab  Result Value Ref Range Status   SARS Coronavirus 2 NEGATIVE NEGATIVE Final    Comment: (NOTE) SARS-CoV-2 target nucleic acids are NOT DETECTED. The SARS-CoV-2 RNA is generally detectable in upper and lower respiratory specimens during the acute phase of infection. Negative results do not preclude SARS-CoV-2 infection, do not rule out co-infections with other pathogens, and should not be used as the sole basis for treatment or other patient management  decisions. Negative results must be combined with clinical observations, patient history, and epidemiological information. The expected result is Negative. Fact Sheet for Patients: HairSlick.no Fact Sheet for Healthcare Providers: quierodirigir.com This test is not yet approved or cleared by the [place] FDA and  has been authorized for detection and/or diagnosis of SARS-CoV-2 by FDA under an Emergency Use Authorization (EUA). This EUA will remain  in effect (meaning this test can be used) for the duration of the COVID-19 declaration under Section 56 4(b)(1) of the Act, 21 U.S.C. section 360bbb-3(b)(1), unless the authorization is terminated or revoked sooner. Performed at [facility], [address] [place], [place] [address]   Urine Culture     Status: Abnormal   Collection Time: 12/09/18 10:43 AM   Specimen: Urine, Random  Result Value Ref Range Status   Specimen Description   Final    URINE, RANDOM Performed at [facility], [address] [place], [place] [address]    Special Requests   Final    NONE Performed at [facility], [address] [place], [place] [address]    Culture (A)  Final    <10,000 COLONIES/mL INSIGNIFICANT GROWTH Performed at [facility], [address],  [place], [place] [address]    Report Status 12/10/2018 FINAL  Final  CULTURE, BLOOD (ROUTINE X 2) w Reflex to ID Panel     Status: None (Preliminary result)   Collection Time: 12/11/18  5:42 AM   Specimen: BLOOD  Result Value Ref Range Status   Specimen Description BLOOD RIGHT [facility]  Final   Special Requests   Final    BOTTLES DRAWN AEROBIC AND ANAEROBIC Blood Culture adequate volume   Culture   Final    NO GROWTH < 12 HOURS Performed at [facility], [address] [place], [place] [address]    Report Status PENDING  Incomplete  CULTURE, BLOOD (ROUTINE X 2) w Reflex to ID Panel     Status: None (Preliminary result)   Collection Time: 12/11/18  5:42 AM   Specimen: BLOOD  Result Value Ref Range Status   Specimen Description BLOOD BLOOD RIGHT HAND  Final   Special Requests   Final    BOTTLES DRAWN AEROBIC AND ANAEROBIC Blood Culture results may not be optimal due to an excessive volume of blood received in culture bottles   Culture   Final    NO GROWTH < 12 HOURS Performed at [facility], [address], [place], [place] [address]    Report Status PENDING  Incomplete      Studies: No results found.  Scheduled Meds: . aspirin EC  81 mg Oral Daily  . famotidine  20 mg Oral QHS  . hydrochlorothiazide  25 mg Oral Daily  . isosorbide mononitrate  30 mg Oral Daily  . losartan  50 mg Oral Daily  . metoprolol tartrate  12.5 mg Oral BID  . pantoprazole  40 mg Oral Daily  . saccharomyces boulardii  250 mg Oral BID  . simvastatin  10 mg Oral QHS  . traMADol  50 mg Oral Daily  . warfarin  2.5 mg Oral ONCE-1800  . Warfarin - Pharmacist Dosing Inpatient   Does not apply q1800    Continuous Infusions: . sodium chloride 500 mL (12/10/18 2206)  . lactated ringers Stopped (12/10/18 1251)  . meropenem (MERREM) IV 1 g (12/11/18 0949)     LOS: 4 days     Darlin Drop, MD [facility] Pager [phone]  If 7PM-7AM, please contact  night-coverage [email or web address] Password [email or web address] 12/11/2018, 2:56 PM

## 2018-12-12 LAB — CBC
HCT: 32.4 % — ABNORMAL LOW (ref 36.0–46.0)
Hemoglobin: 11.2 g/dL — ABNORMAL LOW (ref 12.0–15.0)
MCH: 30.6 pg (ref 26.0–34.0)
MCHC: 34.6 g/dL (ref 30.0–36.0)
MCV: 88.5 fL (ref 80.0–100.0)
Platelets: 231 10*3/uL (ref 150–400)
RBC: 3.66 MIL/uL — ABNORMAL LOW (ref 3.87–5.11)
RDW: 13.5 % (ref 11.5–15.5)
WBC: 11.6 10*3/uL — ABNORMAL HIGH (ref 4.0–10.5)
nRBC: 0 % (ref 0.0–0.2)

## 2018-12-12 LAB — CULTURE, BLOOD (ROUTINE X 2): Culture: NO GROWTH

## 2018-12-12 LAB — LACTIC ACID, PLASMA: Lactic Acid, Venous: 1.1 mmol/L (ref 0.5–1.9)

## 2018-12-12 LAB — PROCALCITONIN: Procalcitonin: <0.1 ng/mL

## 2018-12-12 LAB — PROTIME-INR
INR: 2.2 — ABNORMAL HIGH (ref 0.8–1.2)
Prothrombin Time: 24.3 seconds — ABNORMAL HIGH (ref 11.4–15.2)

## 2018-12-12 MED ORDER — SACCHAROMYCES BOULARDII 250 MG PO CAPS: 250.0000 mg | ORAL_CAPSULE | Freq: Two times a day (BID) | ORAL | 0 refills | Status: DC

## 2018-12-12 MED ORDER — CEPHALEXIN 500 MG PO CAPS
500.0000 mg | ORAL_CAPSULE | Freq: Three times a day (TID) | ORAL | Status: DC
  Administered 2018-12-12: 500 mg via ORAL
  Filled 2018-12-12: qty 1

## 2018-12-12 MED ORDER — WARFARIN SODIUM 5 MG PO TABS
5.0000 mg | ORAL_TABLET | Freq: Once | ORAL | Status: DC
  Filled 2018-12-12: qty 1

## 2018-12-12 MED ORDER — CEPHALEXIN 500 MG PO CAPS: 500.0000 mg | ORAL_CAPSULE | Freq: Three times a day (TID) | ORAL | 0 refills | Status: AC

## 2018-12-12 NOTE — Progress Notes (Signed)
Physical Therapy Treatment Patient Details Name: Cynthia Simon MRN: [id number] DOB: Oct 01, 1930 Today's Date: 12/12/2018    History of Present Illness Pt is an 85 y.o. female presenting to hospital 12/07/18 with weakness, generalized fatigue, nausea, and mild SOB; febrile to 101.7 in ED.  Pt admitted with sepsis d/t UTI, hypokalemia, and htn.  PMH includes CAD, a-fib, HOH, MVP, MI, h/o L breast surgery, macular degeneration.    PT Comments    Pt able to ambulate 400 feet (no AD) with SBA to CGA; improved balance noted from initial evaluation (pt did veer R when ambulating when looking up--pt reporting she avoids looking up because of balance concerns).  Able to navigate 2 steps with single UE support (1st trial pt attempted alternating stepping pattern up steps requiring assist but 2nd trial pt CGA navigating 2 steps with step to pattern--pt reports this (step to pattern) is how she normally does it at home).  Will continue to focus on strengthening and higher level balance during hospitalization.   Follow Up Recommendations  Home health PT     Equipment Recommendations  None recommended by PT    Recommendations for Other Services       Precautions / Restrictions Precautions Precautions: Fall(Fall risk score 9) Restrictions Weight Bearing Restrictions: No    Mobility  Bed Mobility Overal bed mobility: Modified Independent             General bed mobility comments: Semi-supine to sit with HOB elevated without any noted difficulties  Transfers Overall transfer level: Independent Equipment used: None Transfers: Sit to/from [employer] to Stand: Independent Stand pivot transfers: Independent(to toilet and recliner)       General transfer comment: steady safe transfers noted  Ambulation/Gait Ambulation/Gait assistance: Supervision;Min guard Gait Distance (Feet): 400 Feet Assistive device: None Gait Pattern/deviations: Step-through  pattern Gait velocity: mildly decreased       Stairs Stairs: Yes Stairs assistance: Min guard;Mod assist Stair Management: One rail Right;One rail Left;Forwards Number of Stairs: (2 steps x2 trials) General stair comments: pt descended 2 steps with R railing with CGA (step to pattern) and then ascended 2 steps with railing on L--initially CGA with pt leading with R LE but when pt lead with L LE up 2nd step pt unable to ascend requiring mod assist to get up 2nd step; pt reporting she normally takes one step at a time and was not sure why she did alternating pattern (usually leads with R foot/LE ascending 1 step at a time); 2nd trial pt CGA with step to pattern ascending/descending 2 steps with same railing set up as 1st trial; stairs simulated with hand hold on railing like pt was holding onto doorframe at home   Wheelchair Mobility    Modified Rankin (Stroke Patients Only)       Balance Overall balance assessment: Needs assistance Sitting-balance support: No upper extremity supported;Feet supported Sitting balance-Leahy Scale: Normal Sitting balance - Comments: steady sitting reaching outside BOS   Standing balance support: No upper extremity supported;During functional activity Standing balance-Leahy Scale: Good Standing balance comment: pt steady ambulating while performing head turns R/L/down and turning 180 degrees and stopping; pt veering to R when looking up but able to self correct (CGA for safety)                            Cognition Arousal/Alertness: Awake/alert Behavior During Therapy: WFL for tasks assessed/performed Overall Cognitive Status: Within Functional Limits for  tasks assessed                                        Exercises      General Comments   Nursing cleared pt for participation in physical therapy.  Pt agreeable to PT session.      Pertinent Vitals/Pain Pain Assessment: No/denies pain  Vitals (HR and O2 on room  air) stable and WFL throughout treatment session.    Home Living                      Prior Function            PT Goals (current goals can now be found in the care plan section) Acute Rehab PT Goals Patient Stated Goal: to improve activity tolerance PT Goal Formulation: With patient Time For Goal Achievement: 12/23/18 Potential to Achieve Goals: Good Progress towards PT goals: Progressing toward goals    Frequency    Min 2X/week      PT Plan Current plan remains appropriate    Co-evaluation              AM-PAC PT "6 Clicks" Mobility   Outcome Measure  Help needed turning from your back to your side while in a flat bed without using bedrails?: None Help needed moving from lying on your back to sitting on the side of a flat bed without using bedrails?: None Help needed moving to and from a bed to a chair (including a wheelchair)?: None Help needed standing up from a chair using your arms (e.g., wheelchair or bedside chair)?: None Help needed to walk in hospital room?: None Help needed climbing 3-5 steps with a railing? : A Little 6 Click Score: 23    End of Session Equipment Utilized During Treatment: Gait belt Activity Tolerance: Patient tolerated treatment well Patient left: in chair;with call bell/phone within reach Nurse Communication: Mobility status;Precautions PT Visit Diagnosis: Unsteadiness on feet (R26.81);Muscle weakness (generalized) (M62.81);Difficulty in walking, not elsewhere classified (R26.2)     Time: 9604-5409 PT Time Calculation (min) (ACUTE ONLY): 23 min  Charges:  $Gait Training: 8-22 mins $Therapeutic Exercise: 8-22 mins                    Hendricks Limes, PT 12/12/18, 9:57 AM

## 2018-12-12 NOTE — Discharge Summary (Addendum)
Discharge Summary  Cynthia Simon WUJ:[id number] DOB: 09-24-1930  PCP: Danella PentonMiller, Mark F, MD  Admit date: 12/07/2018 Discharge date: 12/12/2018  Time spent: 35 minutes  Recommendations for Outpatient Follow-up:  1. Follow-up with your cardiologist 2. Follow-up with your primary care provider 3. Take your medications as prescribed 4. Continue physical therapy 5. Fall precautions  Discharge Diagnoses:  Active Hospital Problems   Diagnosis Date Noted  . Sepsis due to urinary tract infection (HCC) 12/07/2018    Resolved Hospital Problems  No resolved problems to display.    Discharge Condition: Stable  Diet recommendation: Resume previous diet  Vitals:   12/12/18 0511 12/12/18 0530  BP: (!) 177/68 (!) 172/81  Pulse: 61 74  Resp:    Temp:    SpO2:      History of present illness:   Cynthia B Cogginsis a 85 y.o.femalewith Past medical history ofCAD, A. fib, HTN, chronic anticoagulation. Patient presented with complaints of generalized fatigue that started this morning. Patient mentions that she was at her baseline when she went to sleep last night. When she woke up this morning she was severely fatigue and was unable to do any activity. She also reported left sided chest rib pain which she never had before. This pain lasted for few minutes and resolved on its own. She denies any nausea or vomiting denies any dizziness or lightheadedness. She had some chills but no fever. No diarrhea no constipation. No recent change in medications.  ED Course:Presented with above complaint. Had leukocytosis, fever, meeting SIRS criteria. Patient's UA was showing nitrate positive pyuria and patient was referred for admission for possible sepsis secondary to UTI.  Atherbaseline ambulates with assistance independent for most ofherADL;  manageshermedication on herown.  12/12/18: Patient was seen and examined at her bedside this morning.  No acute events overnight.   She has no new complaints.  She denies nausea abdominal pain or dysuria.  Vital signs and labs reviewed and are stable.  On the day of discharge, the patient was hemodynamically stable.  She will need to follow-up with her primary care provider, cardiology, and continue physical therapy posthospitalization.  Patient understands and agrees to plan.  Hospital Course:  Active Problems:   Sepsis due to urinary tract infection (HCC)  Resolved sepsis secondary to E. coli bacteremia, poa -Presented with leukocytosis with WBC of greater than 20 K and fever with T-max of 101.7.  Urinalysison admissionshowed hazy yellow urine with small leukocytes, positive nitrite and many bacteria.  Urine culture insignificant growth Blood cultures drawn on 12/07/2018 + 1 out of 2 bottles for E. coli with resistance for ampicillin. Completed 5 days of meropenem Switch to Keflex 500mg  3 times daily x5 days Repeat blood cultures on 12/11/2018  negative to date. Lactic acid 1.1 procalcitonin less than 0.10 on 12/12/18. Follow-up with your PCP  Chronic A. fib on Coumadin Rate is currently controlled on Lopressor On Coumadin for primary CVA prevention INR is therapeutic at 2.2 on 12/12/18 Coumadin was managed by pharmacy  Hyperlipidemia Continue Zocor  Resolved post repletion: Hypokalemia. -Potassium level was was 2.7. Potassium 3.7 on 12/11/18.  Follow up with your PCP  Cardiovascular issues: HTN/ MVP/ A. Fib/ CAD s/p stent. -Home meds include metoprolol, Imdur, simvastatin, HCTZ, losartanand Coumadin.  -Resumed all. Follow-up with your cardiologist  GERD Continue PPI  Resolved mild diarrhea -Patient states she gets diarrhea after antibiotic use and improved with probiotics.   Continue on Florastor.     Code Status:DNR   Consultants:  None  Procedures: None  Discharge Exam: BP (!) 172/81 (BP Location: Left Arm)   Pulse 74   Temp 98.2 F (36.8 C) (Oral)   Resp 18    Ht  (1.651 m)   Wt 56.7 kg   SpO2 96%   BMI 20.80 kg/m  . General: 85 y.o. year-old female well developed well nourished in no acute distress.  Alert and oriented x4. . Cardiovascular: Regular rate and rhythm with no rubs or gallops.  No thyromegaly or JVD noted.   Marland Kitchen Respiratory: Clear to auscultation with no wheezes or rales. Good inspiratory effort. . Abdomen: Soft nontender nondistended with normal bowel sounds x4 quadrants. . Musculoskeletal: Trace lower extremity edema. 2/4 pulses in all 4 extremities. Marland Kitchen Psychiatry: Mood is appropriate for condition and setting  Discharge Instructions You were cared for by a hospitalist during your hospital stay. If you have any questions about your discharge medications or the care you received while you were in the hospital after you are discharged, you can call the unit and asked to speak with the hospitalist on call if the hospitalist that took care of you is not available. Once you are discharged, your primary care physician will handle any further medical issues. Please note that NO REFILLS for any discharge medications will be authorized once you are discharged, as it is imperative that you return to your primary care physician (or establish a relationship with a primary care physician if you do not have one) for your aftercare needs so that they can reassess your need for medications and monitor your lab values.   Allergies as of 12/12/2018      Reactions   Amlodipine Besylate Swelling   In feet and ankles      Medication List    TAKE these medications   Aflibercept 2 MG/0.05ML Soln Apply 0.05 mLs to eye every 30 (thirty) days.   aspirin EC 81 MG tablet Take 81 mg by mouth daily.   cephALEXin 500 MG capsule Commonly known as: KEFLEX Take 1 capsule (500 mg total) by mouth every 8 (eight) hours for 5 days.   famotidine 20 MG tablet Commonly known as: PEPCID Take 20 mg by mouth at bedtime.   hydrochlorothiazide 25 MG  tablet Commonly known as: HYDRODIURIL Take 25 mg by mouth daily.   isosorbide mononitrate 30 MG 24 hr tablet Commonly known as: IMDUR Take 30 mg by mouth daily.   loratadine 10 MG tablet Commonly known as: CLARITIN Take 10 mg by mouth daily as needed for allergies.   losartan 50 MG tablet Commonly known as: COZAAR Take 50 mg by mouth daily.   metoprolol tartrate 25 MG tablet Commonly known as: LOPRESSOR Take 12.5 mg by mouth 2 (two) times daily.   nitroGLYCERIN 0.4 MG SL tablet Commonly known as: NITROSTAT Place 0.4 mg under the tongue every 5 (five) minutes as needed for chest pain.   pantoprazole 40 MG tablet Commonly known as: PROTONIX Take 40 mg by mouth daily.   saccharomyces boulardii 250 MG capsule Commonly known as: FLORASTOR Take 1 capsule (250 mg total) by mouth 2 (two) times daily.   simvastatin 10 MG tablet Commonly known as: ZOCOR Take 10 mg by mouth at bedtime.   traMADol 50 MG tablet Commonly known as: ULTRAM Take 50 mg by mouth daily.   warfarin 5 MG tablet Commonly known as: COUMADIN Take 2.5-5 mg by mouth See admin instructions. Warfarin 5 mg daily at bedtime alternating with warfarin 2.5 mg daily  at bedtime      Allergies  Allergen Reactions  . Amlodipine Besylate Swelling    In feet and ankles   Follow-up Information    Danella Penton, MD. Call in 1 day(s).   Specialty: Internal Medicine Why: Please call for a post Hospital follow-up. Contact information: [address] [facility] [place] [address] [phone]        Marcina Millard, MD. Call in 1 day(s).   Specialty: Cardiology Why: Please call for a post hospital follow up appointment. Contact information: [address] [facility] [place] [address] [phone]            The results of significant diagnostics from this hospitalization (including imaging, microbiology, ancillary and laboratory) are  listed below for reference.    Significant Diagnostic Studies: Dg Chest 2 View  Result Date: 12/07/2018 CLINICAL DATA:  85 year old female with a history of shortness of breath and fever EXAM: CHEST - 2 VIEW COMPARISON:  11/29/2017 FINDINGS: Cardiomediastinal silhouette unchanged in size and contour. No pneumothorax or pleural effusion. No confluent airspace disease. Coarsened interstitial markings bilateral lungs. Pleuroparenchymal thickening at the apices is unchanged stigmata of emphysema, with increased retrosternal airspace, flattened hemidiaphragms, increased AP diameter, and hyperinflation on the AP view. Degenerative changes the spine.  No acute displaced fracture. IMPRESSION: Chronic lung changes and emphysema without evidence of acute cardiopulmonary disease Electronically Signed   By: Gilmer Mor D.O.   On: 12/07/2018 13:06    Microbiology: Recent Results (from the past 240 hour(s))  Culture, blood (routine x 2)     Status: None   Collection Time: 12/07/18 12:22 PM   Specimen: BLOOD  Result Value Ref Range Status   Specimen Description BLOOD LT FOREARM  Final   Special Requests   Final    BOTTLES DRAWN AEROBIC AND ANAEROBIC Blood Culture results may not be optimal due to an inadequate volume of blood received in culture bottles   Culture   Final    NO GROWTH 5 DAYS Performed at [facility], [address], [place], [place] [address]    Report Status 12/12/2018 FINAL  Final  Culture, blood (routine x 2)     Status: Abnormal   Collection Time: 12/07/18 12:22 PM   Specimen: BLOOD  Result Value Ref Range Status   Specimen Description   Final    BLOOD RT [facility] Performed at [facility], [address], [place], [place] [address]    Special Requests   Final    BOTTLES DRAWN AEROBIC AND ANAEROBIC Blood Culture results may not be optimal due to an excessive volume of blood received in culture bottles Performed at [facility], [address]  [place], [place] [address]    Culture  Setup Time   Final    GRAM NEGATIVE RODS ANAEROBIC BOTTLE ONLY CRITICAL RESULT CALLED TO, READ BACK BY AND VERIFIED WITH: Mila Merry AT 3903 12/10/2018 SDR Performed at [facility], [address], [place], [place] [address]    Culture ESCHERICHIA COLI (A)  Final   Report Status 12/12/2018 FINAL  Final   Organism ID, Bacteria ESCHERICHIA COLI  Final      Susceptibility   Escherichia coli - MIC*    AMPICILLIN >=32 RESISTANT Resistant     CEFAZOLIN <=4 SENSITIVE Sensitive     CEFEPIME <=1 SENSITIVE Sensitive     CEFTAZIDIME <=1 SENSITIVE Sensitive     CEFTRIAXONE <=1 SENSITIVE Sensitive  CIPROFLOXACIN <=0.25 SENSITIVE Sensitive     GENTAMICIN <=1 SENSITIVE Sensitive     IMIPENEM <=0.25 SENSITIVE Sensitive     TRIMETH/SULFA <=20 SENSITIVE Sensitive     AMPICILLIN/SULBACTAM 8 SENSITIVE Sensitive     PIP/TAZO <=4 SENSITIVE Sensitive     Extended ESBL NEGATIVE Sensitive     * ESCHERICHIA COLI  Blood Culture ID Panel (Reflexed)     Status: Abnormal   Collection Time: 12/07/18 12:22 PM  Result Value Ref Range Status   Enterococcus species NOT DETECTED NOT DETECTED Final   Listeria monocytogenes NOT DETECTED NOT DETECTED Final   Staphylococcus species NOT DETECTED NOT DETECTED Final   Staphylococcus aureus (BCID) NOT DETECTED NOT DETECTED Final   Streptococcus species NOT DETECTED NOT DETECTED Final   Streptococcus agalactiae NOT DETECTED NOT DETECTED Final   Streptococcus pneumoniae NOT DETECTED NOT DETECTED Final   Streptococcus pyogenes NOT DETECTED NOT DETECTED Final   Acinetobacter baumannii NOT DETECTED NOT DETECTED Final   Enterobacteriaceae species DETECTED (A) NOT DETECTED Final    Comment: Enterobacteriaceae represent a large family of gram-negative bacteria, not a single organism. CRITICAL RESULT CALLED TO, READ BACK BY AND VERIFIED WITH:  Rito Ehrlich AT 1610 12/10/2018 SDR    Enterobacter cloacae complex NOT DETECTED NOT  DETECTED Final   Escherichia coli DETECTED (A) NOT DETECTED Final    Comment: CRITICAL RESULT CALLED TO, READ BACK BY AND VERIFIED WITH:  Rito Ehrlich AT 9604 12/10/2018 SDR    Klebsiella oxytoca NOT DETECTED NOT DETECTED Final   Klebsiella pneumoniae NOT DETECTED NOT DETECTED Final   Proteus species NOT DETECTED NOT DETECTED Final   Serratia marcescens NOT DETECTED NOT DETECTED Final   Carbapenem resistance NOT DETECTED NOT DETECTED Final   Haemophilus influenzae NOT DETECTED NOT DETECTED Final   Neisseria meningitidis NOT DETECTED NOT DETECTED Final   Pseudomonas aeruginosa NOT DETECTED NOT DETECTED Final   Candida albicans NOT DETECTED NOT DETECTED Final   Candida glabrata NOT DETECTED NOT DETECTED Final   Candida krusei NOT DETECTED NOT DETECTED Final   Candida parapsilosis NOT DETECTED NOT DETECTED Final   Candida tropicalis NOT DETECTED NOT DETECTED Final    Comment: Performed at [facility], [place], [place] [address]  SARS CORONAVIRUS 2 (TAT 6-24 HRS) Nasopharyngeal Nasopharyngeal Swab     Status: None   Collection Time: 12/07/18 12:58 PM   Specimen: Nasopharyngeal Swab  Result Value Ref Range Status   SARS Coronavirus 2 NEGATIVE NEGATIVE Final    Comment: (NOTE) SARS-CoV-2 target nucleic acids are NOT DETECTED. The SARS-CoV-2 RNA is generally detectable in upper and lower respiratory specimens during the acute phase of infection. Negative results do not preclude SARS-CoV-2 infection, do not rule out co-infections with other pathogens, and should not be used as the sole basis for treatment or other patient management decisions. Negative results must be combined with clinical observations, patient history, and epidemiological information. The expected result is Negative. Fact Sheet for Patients: SugarRoll.be Fact Sheet for Healthcare Providers: https://www.woods-mathews.com/ This test is not yet approved  or cleared by the [place] FDA and  has been authorized for detection and/or diagnosis of SARS-CoV-2 by FDA under an Emergency Use Authorization (EUA). This EUA will remain  in effect (meaning this test can be used) for the duration of the COVID-19 declaration under Section 56 4(b)(1) of the Act, 21 U.S.C. section 360bbb-3(b)(1), unless the authorization is terminated or revoked sooner. Performed at [facility], [place] [address] [place], [place]  [address]   Urine Culture     Status: Abnormal   Collection Time: 12/09/18 10:43 AM   Specimen: Urine, Random  Result Value Ref Range Status   Specimen Description   Final    URINE, RANDOM Performed at [facility], [address] [place], [place] [address]    Special Requests   Final    NONE Performed at [facility], [address] [place], [place] [address]    Culture (A)  Final    <10,000 COLONIES/mL INSIGNIFICANT GROWTH Performed at [facility], [address] [place], [place] [address]    Report Status 12/10/2018 FINAL  Final  CULTURE, BLOOD (ROUTINE X 2) w Reflex to ID Panel     Status: None (Preliminary result)   Collection Time: 12/11/18  5:42 AM   Specimen: BLOOD  Result Value Ref Range Status   Specimen Description BLOOD RIGHT AC  Final   Special Requests   Final    BOTTLES DRAWN AEROBIC AND ANAEROBIC Blood Culture adequate volume   Culture   Final    NO GROWTH 1 DAY Performed at [facility], [address] [place], [place] [address]    Report Status PENDING  Incomplete  CULTURE, BLOOD (ROUTINE X 2) w Reflex to ID Panel     Status: None (Preliminary result)   Collection Time: 12/11/18  5:42 AM   Specimen: BLOOD  Result Value Ref Range Status   Specimen Description BLOOD BLOOD RIGHT HAND  Final   Special Requests   Final    BOTTLES DRAWN AEROBIC AND ANAEROBIC Blood Culture results may not be optimal due to an excessive volume of blood received in culture bottles    Culture   Final    NO GROWTH 1 DAY Performed at [facility], [address] [place], [place] [address]    Report Status PENDING  Incomplete     Labs: Basic Metabolic Panel: Recent Labs  Lab 12/07/18 1206 12/08/18 0651 12/09/18 0831 12/11/18 0434  NA 132* 132* 134* 138  K 3.1* 2.7* 4.1 3.7  CL 99 99 101 103  CO2 21* GLUCOSE 136* 114* 100* 118*  BUN CREATININE 0.59 0.51 0.49 0.55  CALCIUM 8.5* 8.3* 8.6* 8.9   Liver Function Tests: Recent Labs  Lab 12/07/18 1206 12/08/18 0651  AST 22 16  ALT 19 16  ALKPHOS 45 43  BILITOT 1.1 1.1  PROT 6.1* 5.7*  ALBUMIN 3.1* 2.7*   No results for input(s): LIPASE, AMYLASE in the last 168 hours. No results for input(s): AMMONIA in the last 168 hours. CBC: Recent Labs  Lab 12/07/18 1206 12/08/18 0651 12/09/18 0831 12/11/18 0434 12/12/18 0434  WBC 20.2* 17.7* 17.7* 11.1* 11.6*  NEUTROABS 16.1*  --  14.8*  --   --   HGB 12.0 11.1* 11.4* 10.9* 11.2*  HCT 36.3 32.1* 34.9* 33.9* 32.4*  MCV 93.1 89.9 94.3 94.2 88.5  PLT 245 204 204 219 231   Cardiac Enzymes: No results for input(s): CKTOTAL, CKMB, CKMBINDEX, TROPONINI in the last 168 hours. BNP: BNP (last 3 results) No results for input(s): BNP in the last 8760 hours.  ProBNP (last 3 results) No results for input(s): PROBNP in the last 8760 hours.  CBG: Recent Labs  Lab 12/11/18 1118  GLUCAP 110*       Signed:  Darlin Drop, MD [facility] 12/12/2018, 12:31 PM

## 2018-12-12 NOTE — Discharge Instructions (Signed)
Bacteremia, Adult Bacteremia is the presence of bacteria in the blood. When bacteria enter the bloodstream, they can cause a life-threatening reaction called sepsis, which is a medical emergency. Bacteremia can spread to other parts of the body, including the heart, joints, and brain. What are the causes? This condition is caused by bacteria that get into the blood.  Bacteria can enter the blood: ? From a skin infection or injury, such as a burn or a cut. ? From a lung infection (pneumonia). ? From an infection in your stomach or intestines (gastrointestinal infection). ? From an infection in your bladder or urinary system (urinary tract infection). ? During a dental or medical procedure. ? From bleeding gums. ? When a bacterial infection in another part of your body spreads to your blood. ? Through an unclean (contaminated) needle. What increases the risk? This condition is more likely to develop in children, the elderly, and people who:  Have a long-term (chronic) disease or condition like diabetes or chronic kidney failure.  Have an artificial joint or heart valve.  Have heart valve disease.  Have a tube inserted to treat a medical condition, such as a urinary catheter or IV.  Have a weak disease-fighting system (immune system).  Inject illegal drugs.  Have been hospitalized for more than 10 days in a row. What are the signs or symptoms? Symptoms of this condition include:  Fever.  Chills.  Fast heartbeat.  Shortness of breath.  Dizziness.  Weakness.  Confusion.  Nausea or vomiting.  Diarrhea.  Low blood pressure.  Decreased urine output. Bacteremia that has spread to other parts of the body may cause symptoms in those areas. In some cases, there are no symptoms. How is this diagnosed? This condition may be diagnosed with a physical exam and tests, such as:  A complete blood count (CBC). This test checks for signs of infection.  Blood cultures. These  check for bacteria in your blood.  Tests of any tubes that you have had inserted. These tests check for a source of infection.  Urine tests, including urine cultures. These check for bacteria in the urine that could be a source of infection.  Imaging tests, such as an X-ray, CT scan, MRI, or heart ultrasound. These check for a source of infection in other parts of your body, such as your lungs, heart valves, or joints. How is this treated? This condition is usually treated in the hospital. Treatment may involve:  Antibiotic medicines. These may be given by mouth (orally) or directly into your blood through an IV (infusion through your vein). ? Depending on the source of infection, you may need antibiotics for several weeks. ? At first, you may be given an antibiotic to kill most types of blood bacteria (broad-spectrum antibiotic). If your test results show that a certain kind of bacteria is causing the problem, you may be given a different antibiotic to kill that specific bacteria.  IV fluids.  Removing any catheter or device that could be a source of infection.  Blood pressure and breathing support, if needed.  Surgery to control the source or the spread of infection, such as surgery to remove an infected device, abscess, or tissue.  Having follow-up visits for medicines, blood tests, and further evaluation. Follow these instructions at home: Medicines  Take over-the-counter and prescription medicines only as told by your health care provider.  If you were prescribed an antibiotic medicine, take it as told by your health care provider. Do not stop taking the   antibiotic even if you start to feel better. General instructions   Rest as needed. Ask your health care provider when you may return to normal activities.  Drink enough fluid to keep your urine pale yellow.  Do not use any products that contain nicotine or tobacco, such as cigarettes and e-cigarettes. If you need help  quitting, ask your health care provider.  Keep all follow-up visits as told by your health care provider. This is important. How is this prevented?   Wash your hands regularly with soap and water. If soap and water are not available, use hand sanitizer.  You should wash your hands: ? After using the toilet or changing a diaper. ? Before preparing, cooking, or serving food. ? While caring for a sick person or while visiting someone in a hospital. ? Before and after changing bandages (dressings) over wounds.  Clean any scrapes or cuts with soap and water and cover them with clean dressings.  Get vaccinations as recommended by your health care provider.  Practice good oral hygiene. Brush your teeth two times a day, and floss regularly.  Take good care of your skin. This includes bathing and moisturizing on a regular basis. Get help right away if you have:  Pain.  A fever or chills.  Trouble breathing.  A fast heart rate.  Skin that is blotchy, pale, or clammy.  Confusion.  Weakness.  Lack of energy (lethargy) or unusual sleepiness.  Diarrhea.  New symptoms that develop after treatment has started. These symptoms may represent a serious problem that is an emergency. Do not wait to see if the symptoms will go away. Get medical help right away. Call your local emergency services (911 in the [place] Do not drive yourself to the hospital. Summary  Bacteremia is the presence of bacteria in the blood. When bacteria enter the bloodstream, they can cause a life-threatening reaction called sepsis.  Some symptoms of bacteremia include fever, chills, shortness of breath, confusion, nausea or vomiting, and diarrhea.  Tests may be done to find the source of infection that led to bacteremia. These tests may include blood tests, urine tests, and imaging tests.  Bacteremia is usually treated with antibiotic medicines in the hospital.  Get help right away if you have any new symptoms  that develop after treatment has started. This information is not intended to replace advice given to you by your health care provider. Make sure you discuss any questions you have with your health care provider. Document Released: 10/30/2005 Document Revised: 05/28/2017 Document Reviewed: 05/28/2017 Elsevier Patient Education  2020 [employer]  

## 2018-12-12 NOTE — Progress Notes (Addendum)
ANTICOAGULATION CONSULT NOTE   Pharmacy Consult for Warfarin  Indication: atrial fibrillation  Allergies  Allergen Reactions  . Amlodipine Besylate Swelling    In feet and ankles    Patient Measurements: Height: 5\' 5"  (165.1 cm) Weight: 125 lb (56.7 kg) IBW/kg (Calculated) : 57  Vital Signs: Temp: 98.2 F (36.8 C) (11/12 0404) Temp Source: Oral (11/12 0404) BP: 172/81 (11/12 0530) Pulse Rate: 74 (11/12 0530)  Labs: Recent Labs    12/09/18 0831 12/10/18 0432 12/11/18 0434 12/12/18 0434  HGB 11.4*  --  10.9* 11.2*  HCT 34.9*  --  33.9* 32.4*  PLT 204  --  219 231  LABPROT  --  24.9* 24.2* 24.3*  INR  --  2.3* 2.2* 2.2*  CREATININE 0.49  --  0.55  --     Estimated Creatinine Clearance: 46 mL/min (by C-G formula based on SCr of 0.55 mg/dL).   Medical History: Past Medical History:  Diagnosis Date  . Arthritis   . Carotid artery disease (HCC)    BLOCKAGE  . Coronary artery disease   . Dysrhythmia    AFIB  . HOH (hard of hearing)   . Hypertension   . MVP (mitral valve prolapse)   . Myocardial infarction (HCC)    MILD  . Palpitations     Medications:  Medications Prior to Admission  Medication Sig Dispense Refill Last Dose  . Aflibercept 2 MG/0.05ML SOLN Apply 0.05 mLs to eye every 30 (thirty) days.   Past Month at Unknown time  . aspirin EC 81 MG tablet Take 81 mg by mouth daily.   12/07/2018 at 1000  . famotidine (PEPCID) 20 MG tablet Take 20 mg by mouth at bedtime.   12/06/2018 at Unknown time  . hydrochlorothiazide (HYDRODIURIL) 25 MG tablet Take 25 mg by mouth daily.   12/07/2018 at 1000  . isosorbide mononitrate (IMDUR) 30 MG 24 hr tablet Take 30 mg by mouth daily.   12/07/2018 at 1000  . loratadine (CLARITIN) 10 MG tablet Take 10 mg by mouth daily as needed for allergies.    prn at prn  . losartan (COZAAR) 50 MG tablet Take 50 mg by mouth daily.   12/06/2018 at Unknown time  . metoprolol tartrate (LOPRESSOR) 25 MG tablet Take 12.5 mg by mouth 2 (two)  times daily.    12/07/2018 at 1000  . nitroGLYCERIN (NITROSTAT) 0.4 MG SL tablet Place 0.4 mg under the tongue every 5 (five) minutes as needed for chest pain.   prn at prn  . pantoprazole (PROTONIX) 40 MG tablet Take 40 mg by mouth daily.   12/07/2018 at 1000  . simvastatin (ZOCOR) 10 MG tablet Take 10 mg by mouth at bedtime.    12/06/2018 at Unknown time  . traMADol (ULTRAM) 50 MG tablet Take 50 mg by mouth daily.    12/07/2018 at 1000  . warfarin (COUMADIN) 5 MG tablet Take 2.5-5 mg by mouth See admin instructions. Warfarin 5 mg daily at bedtime alternating with warfarin 2.5 mg daily at bedtime   12/06/2018 at Unknown time    Assessment: Pharmacy consulted to dose warfarin in this 85 year old female admitted with Afib.  On warfarin PTA.  Pt takes warfarin 5 mg PO QOD alternating with warfarin 2.5 mg PO QOD.   Last dose was 5 mg on 11/6. 11/7: INR = 2.1  Today's INR: INR is therapeutic.  Date INR Warfarin Dose  11/7 2.1 2.5 mg  11/8 2.5 2.5 mg  11/9 2.5 2.5  mg   11/10 2.3 5 mg  11/11 2.2 2.5 mg  11/12 2.2      Goal of Therapy:  INR 2-3   Plan:  Will order warfarin 5 mg for 1800 today. Daily INR ordered. CBC q3 days (next CBC due on 11/14).   Update @1204 : MD started Keflex for suspected bacteremia, will continue to check INR daily while on antibiotic.   Rowland Lathe, PharmD 12/12/2018,7:34 AM

## 2018-12-12 NOTE — TOC Transition Note (Signed)
Transition of Care [facility]) - CM/SW Discharge Note   Patient Details  Name: Cynthia Simon MRN: [id number] Date of Birth: [birth year]  Transition of Care [facility]) CM/SW Contact:  Beverly Sessions, RN Phone Number: 12/12/2018, 5:17 PM   Clinical Narrative:    Patient to discharge home today Daughter at bedside and will transport home  Patient and daughter live together  PCP Sabra Heck.  Baseline patient drives herself to appointments  [facility] - denies issues obtaining medications  PT has assessed patient and recommends home health PT.  Patient in agreement.  States she does not have a preference of home health agency.  Referral made to [facility]    Final next level of care: Home w Home Health Services Barriers to Discharge: Barriers Resolved   Patient Goals and CMS Choice   CMS Medicare.gov Compare Post Acute Care list provided to:: Patient Choice offered to / list presented to : Patient, Adult Children  Discharge Placement                       Discharge Plan and Services                          HH Arranged: PT, OT [facility] Agency: [facility] Date [place]: 12/12/18   Representative spoke with at [place]: Cassie  Social Determinants of Health ([place]) Interventions     Readmission Risk Interventions No flowsheet data found.

## 2018-12-16 LAB — CULTURE, BLOOD (ROUTINE X 2)
Culture: NO GROWTH
Culture: NO GROWTH
Special Requests: ADEQUATE

## 2019-07-24 ENCOUNTER — Emergency Department
Admission: EM | Admit: 2019-07-24 | Discharge: 2019-07-24 | Disposition: A | Discharge status: Home / Self Care | Payer: Medicare Other | Attending: Emergency Medicine | Admitting: Emergency Medicine

## 2019-07-24 ENCOUNTER — Other Ambulatory Visit: Payer: Self-pay

## 2019-07-24 ENCOUNTER — Emergency Department: Payer: Medicare Other

## 2019-07-24 DIAGNOSIS — R531 Weakness: Secondary | ICD-10-CM | POA: Insufficient documentation

## 2019-07-24 DIAGNOSIS — R110 Nausea: Secondary | ICD-10-CM | POA: Insufficient documentation

## 2019-07-24 DIAGNOSIS — Z5321 Procedure and treatment not carried out due to patient leaving prior to being seen by health care provider: Secondary | ICD-10-CM | POA: Diagnosis not present

## 2019-07-24 LAB — URINALYSIS, COMPLETE (UACMP) WITH MICROSCOPIC
Bacteria, UA: NONE SEEN
Bilirubin Urine: NEGATIVE
Glucose, UA: NEGATIVE mg/dL
Ketones, ur: 5 mg/dL — AB
Nitrite: NEGATIVE
Protein, ur: NEGATIVE mg/dL
Specific Gravity, Urine: 1.015 (ref 1.005–1.030)
pH: 6 (ref 5.0–8.0)

## 2019-07-24 LAB — CBC
HCT: 34.3 % — ABNORMAL LOW (ref 36.0–46.0)
Hemoglobin: 11.5 g/dL — ABNORMAL LOW (ref 12.0–15.0)
MCH: 30.7 pg (ref 26.0–34.0)
MCHC: 33.5 g/dL (ref 30.0–36.0)
MCV: 91.7 fL (ref 80.0–100.0)
Platelets: 243 10*3/uL (ref 150–400)
RBC: 3.74 MIL/uL — ABNORMAL LOW (ref 3.87–5.11)
RDW: 13.5 % (ref 11.5–15.5)
WBC: 9.2 10*3/uL (ref 4.0–10.5)
nRBC: 0 % (ref 0.0–0.2)

## 2019-07-24 LAB — COMPREHENSIVE METABOLIC PANEL
ALT: 13 U/L (ref 0–44)
AST: 19 U/L (ref 15–41)
Albumin: 4.1 g/dL (ref 3.5–5.0)
Alkaline Phosphatase: 47 U/L (ref 38–126)
Anion gap: 11 (ref 5–15)
BUN: 14 mg/dL (ref 8–23)
CO2: 24 mmol/L (ref 22–32)
Calcium: 9.1 mg/dL (ref 8.9–10.3)
Chloride: 98 mmol/L (ref 98–111)
Creatinine, Ser: 0.8 mg/dL (ref 0.44–1.00)
GFR calc Af Amer: >60 mL/min (ref >60)
GFR calc non Af Amer: >60 mL/min (ref >60)
Glucose, Bld: 101 mg/dL — ABNORMAL HIGH (ref 70–99)
Potassium: 3.9 mmol/L (ref 3.5–5.1)
Sodium: 133 mmol/L — ABNORMAL LOW (ref 135–145)
Total Bilirubin: 1.4 mg/dL — ABNORMAL HIGH (ref 0.3–1.2)
Total Protein: 7.1 g/dL (ref 6.5–8.1)

## 2019-07-24 LAB — TROPONIN I (HIGH SENSITIVITY): Troponin I (High Sensitivity): 10 ng/L (ref <18)

## 2019-07-24 NOTE — ED Triage Notes (Signed)
Pt brought in by EMS with co generalized weakness that started today. States feels off balance, has nausea no vomiting. Pt states has had soft stools today, no dysuria. Pt has had problems with hyponatremia recently.

## 2020-02-10 ENCOUNTER — Emergency Department
Admission: EM | Admit: 2020-02-10 | Discharge: 2020-02-10 | Disposition: A | Discharge status: Home / Self Care | Payer: Medicare Other | Attending: Emergency Medicine | Admitting: Emergency Medicine

## 2020-02-10 ENCOUNTER — Other Ambulatory Visit: Payer: Self-pay

## 2020-02-10 DIAGNOSIS — I10 Essential (primary) hypertension: Secondary | ICD-10-CM | POA: Diagnosis not present

## 2020-02-10 DIAGNOSIS — Z7982 Long term (current) use of aspirin: Secondary | ICD-10-CM | POA: Diagnosis not present

## 2020-02-10 DIAGNOSIS — Z7901 Long term (current) use of anticoagulants: Secondary | ICD-10-CM | POA: Diagnosis not present

## 2020-02-10 DIAGNOSIS — I2510 Atherosclerotic heart disease of native coronary artery without angina pectoris: Secondary | ICD-10-CM | POA: Diagnosis not present

## 2020-02-10 DIAGNOSIS — Z79899 Other long term (current) drug therapy: Secondary | ICD-10-CM | POA: Insufficient documentation

## 2020-02-10 DIAGNOSIS — R040 Epistaxis: Secondary | ICD-10-CM | POA: Diagnosis present

## 2020-02-10 DIAGNOSIS — Z9861 Coronary angioplasty status: Secondary | ICD-10-CM | POA: Diagnosis not present

## 2020-02-10 LAB — CBC WITH DIFFERENTIAL/PLATELET
Abs Immature Granulocytes: 0.05 10*3/uL (ref 0.00–0.07)
Basophils Absolute: 0.1 10*3/uL (ref 0.0–0.1)
Basophils Relative: 1 %
Eosinophils Absolute: 0.2 10*3/uL (ref 0.0–0.5)
Eosinophils Relative: 2 %
HCT: 35.1 % — ABNORMAL LOW (ref 36.0–46.0)
Hemoglobin: 11.4 g/dL — ABNORMAL LOW (ref 12.0–15.0)
Immature Granulocytes: 1 %
Lymphocytes Relative: 19 %
Lymphs Abs: 1.7 10*3/uL (ref 0.7–4.0)
MCH: 30.6 pg (ref 26.0–34.0)
MCHC: 32.5 g/dL (ref 30.0–36.0)
MCV: 94.4 fL (ref 80.0–100.0)
Monocytes Absolute: 1.1 10*3/uL — ABNORMAL HIGH (ref 0.1–1.0)
Monocytes Relative: 12 %
Neutro Abs: 6.1 10*3/uL (ref 1.7–7.7)
Neutrophils Relative %: 65 %
Platelets: 265 10*3/uL (ref 150–400)
RBC: 3.72 MIL/uL — ABNORMAL LOW (ref 3.87–5.11)
RDW: 13.7 % (ref 11.5–15.5)
WBC: 9.1 10*3/uL (ref 4.0–10.5)
nRBC: 0 % (ref 0.0–0.2)

## 2020-02-10 LAB — BASIC METABOLIC PANEL
Anion gap: 8 (ref 5–15)
BUN: 18 mg/dL (ref 8–23)
CO2: 25 mmol/L (ref 22–32)
Calcium: 9.5 mg/dL (ref 8.9–10.3)
Chloride: 104 mmol/L (ref 98–111)
Creatinine, Ser: 0.97 mg/dL (ref 0.44–1.00)
GFR, Estimated: 57 mL/min — ABNORMAL LOW (ref >60)
Glucose, Bld: 147 mg/dL — ABNORMAL HIGH (ref 70–99)
Potassium: 4 mmol/L (ref 3.5–5.1)
Sodium: 137 mmol/L (ref 135–145)

## 2020-02-10 LAB — PROTIME-INR
INR: 2.9 — ABNORMAL HIGH (ref 0.8–1.2)
Prothrombin Time: 29.1 seconds — ABNORMAL HIGH (ref 11.4–15.2)

## 2020-02-10 MED ORDER — LIDOCAINE VISCOUS HCL 2 % MT SOLN
15.0000 mL | Freq: Once | OROMUCOSAL | Status: AC
  Administered 2020-02-10: 15 mL via OROMUCOSAL
  Filled 2020-02-10: qty 15

## 2020-02-10 MED ORDER — PHYTONADIONE 5 MG PO TABS
2.5000 mg | ORAL_TABLET | Freq: Once | ORAL | Status: AC
  Administered 2020-02-10: 2.5 mg via ORAL
  Filled 2020-02-10: qty 1

## 2020-02-10 MED ORDER — AMOXICILLIN-POT CLAVULANATE 875-125 MG PO TABS: 1.0000 | ORAL_TABLET | Freq: Two times a day (BID) | ORAL | 0 refills | Status: AC

## 2020-02-10 MED ORDER — TRANEXAMIC ACID FOR EPISTAXIS
500.0000 mg | Freq: Once | TOPICAL | Status: AC
  Administered 2020-02-10: 500 mg via TOPICAL
  Filled 2020-02-10: qty 10

## 2020-02-10 NOTE — ED Notes (Signed)
ED Provider at bedside. 

## 2020-02-10 NOTE — ED Notes (Signed)
Pt noted to be bleeding from right eye, Dr Katrinka Blazing notified and to bedside. Pt positioned with head leaning forward

## 2020-02-10 NOTE — ED Provider Notes (Addendum)
[facility] Emergency Department Provider Note  ____________________________________________   Event Date/Time   First MD Initiated Contact with Patient 02/10/20 1502     (approximate)  I have reviewed the triage vital signs and the nursing notes.   HISTORY  Chief Complaint Epistaxis    HPI Cynthia Simon is a 87 y.o. female A. fib and is on Coumadin.  She reports that she had nosebleeding starting this afternoon constant, nothing makes it better, nothing makes it worse.  Denies a history of nosebleeds previously.  She was recently told by her doctor that her INR level was too high and they went down on her warfarin.  She last took her warfarin yesterday.  She denies any falls or hitting her nose.  She otherwise feels at her baseline self.            Past Medical History:  Diagnosis Date  . Arthritis   . Carotid artery disease (HCC)    BLOCKAGE  . Coronary artery disease   . Dysrhythmia    AFIB  . HOH (hard of hearing)   . Hypertension   . MVP (mitral valve prolapse)   . Myocardial infarction (HCC)    MILD  . Palpitations     Patient Active Problem List   Diagnosis Date Noted  . Sepsis due to urinary tract infection (HCC) 12/07/2018    Past Surgical History:  Procedure Laterality Date  . BREAST SURGERY Left    BIOPSY  . CATARACT EXTRACTION W/PHACO Left 01/18/2016   Procedure: CATARACT EXTRACTION PHACO AND INTRAOCULAR LENS PLACEMENT (IOC);  Surgeon: Galen Manila, MD;  Location: [facility] ORS;  Service: Ophthalmology;  Laterality: Left;  PACK EPP:[id number] H US:01:40 AP:59.4 CDE:26.44  . CORONARY ANGIOPLASTY     STENT  . DILATION AND CURETTAGE OF UTERUS      Prior to Admission medications   Medication Sig Start Date End Date Taking? Authorizing Provider  Aflibercept 2 MG/0.05ML SOLN Apply 0.05 mLs to eye every 30 (thirty) days.    [provider]  aspirin EC 81 MG tablet Take 81 mg by mouth daily.    [provider]  famotidine (PEPCID) 20 MG tablet Take 20 mg by mouth at bedtime. 09/20/18 09/20/19  [provider]  hydrochlorothiazide (HYDRODIURIL) 25 MG tablet Take 25 mg by mouth daily.    [provider]  isosorbide mononitrate (IMDUR) 30 MG 24 hr tablet Take 30 mg by mouth daily.    [provider]  loratadine (CLARITIN) 10 MG tablet Take 10 mg by mouth daily as needed for allergies.     [provider]  losartan (COZAAR) 50 MG tablet Take 50 mg by mouth daily.    [provider]  metoprolol tartrate (LOPRESSOR) 25 MG tablet Take 12.5 mg by mouth 2 (two) times daily.     [provider]  nitroGLYCERIN (NITROSTAT) 0.4 MG SL tablet Place 0.4 mg under the tongue every 5 (five) minutes as needed for chest pain.    [provider]  pantoprazole (PROTONIX) 40 MG tablet Take 40 mg by mouth daily.    [provider]  saccharomyces boulardii (FLORASTOR) 250 MG capsule Take 1 capsule (250 mg total) by mouth 2 (two) times daily. 12/12/18   Darlin Drop, DO  simvastatin (ZOCOR) 10 MG tablet Take 10 mg by mouth at bedtime.     [provider]  traMADol (ULTRAM) 50 MG tablet Take 50 mg by mouth daily.     [provider]  warfarin (COUMADIN) 5 MG tablet Take 2.5-5 mg by mouth See admin instructions. Warfarin 5 mg daily at bedtime alternating with warfarin 2.5 mg daily at bedtime    [provider]    Allergies Amlodipine besylate  No family history on file.  Social History Social History   Tobacco Use  . Smoking status: Never Smoker  . Smokeless tobacco: Never Used  Substance Use Topics  . Alcohol use: No  . Drug use: No      Review of Systems Constitutional: No fever/chills Eyes: No visual changes.  Blood of her right eye ENT: No sore throat.  Nosebleed. Cardiovascular: Denies chest pain. Respiratory: Denies shortness of breath. Gastrointestinal: No abdominal pain.  No nausea, no  vomiting.  No diarrhea.  No constipation. Genitourinary: Negative for dysuria. Musculoskeletal: Negative for back pain. Skin: Negative for rash. Neurological: Negative for headaches, focal weakness or numbness. All other ROS negative ____________________________________________   PHYSICAL EXAM:  VITAL SIGNS: ED Triage Vitals  Enc Vitals Group     BP 02/10/20 1447 (!) 158/67     Pulse Rate 02/10/20 1447 76     Resp 02/10/20 1447 20     Temp --      Temp src --      SpO2 02/10/20 1447 97 %     Weight 02/10/20 1448 160 lb (72.6 kg)     Height 02/10/20 1448 5\' 6"  (1.676 m)     Head Circumference --      Peak Flow --      Pain Score 02/10/20 1448 0     Pain Loc --      Pain Edu? --      Excl. in GC? --     Constitutional: Alert and oriented. Well appearing and in no acute distress. Eyes: Conjunctivae are normal. EOMI. some oozing blood out of her right eye Head: Atraumatic. Nose: Venous bleeding noted mostly from the right nostril. Mouth/Throat: Mucous membranes are moist.   Neck: No stridor. Trachea Midline. FROM Cardiovascular: Normal rate, regular rhythm. Grossly normal heart sounds.  Good peripheral circulation. Respiratory: Normal respiratory effort.  No retractions. Lungs CTAB. Gastrointestinal: Soft and nontender. No distention. No abdominal bruits.  Musculoskeletal: No lower extremity tenderness nor edema.  No joint effusions. Neurologic:  Normal speech and language. No gross focal neurologic deficits are appreciated.  Skin:  Skin is warm, dry and intact. No rash noted. Psychiatric: Mood and affect are normal. Speech and behavior are normal. GU: Deferred   ____________________________________________   LABS (all labs ordered are listed, but only abnormal results are displayed)  Labs Reviewed  CBC WITH DIFFERENTIAL/PLATELET - Abnormal; Notable for the following components:      Result Value   RBC 3.72 (*)    Hemoglobin 11.4 (*)    HCT 35.1 (*)    Monocytes  Absolute 1.1 (*)    All other components within normal limits  BASIC METABOLIC PANEL - Abnormal; Notable for the following components:   Glucose, Bld 147 (*)    GFR, Estimated 57 (*)    All other components within normal limits  PROTIME-INR - Abnormal; Notable for the following components:   Prothrombin Time 29.1 (*)    INR 2.9 (*)    All other components within normal limits   ____________________________________________    PROCEDURES  Procedure(s) performed (including Critical Care):  .Epistaxis Management  Date/Time: 02/10/2020 5:08 PM Performed by: 04/09/2020, MD Authorized by: Concha Se, MD   Consent:  Consent obtained:  Emergent situation   Consent given by:  Patient   Risks, benefits, and alternatives were discussed: yes     Risks discussed:  Bleeding, nasal injury and pain   Alternatives discussed:  No treatment Procedure details:    Treatment site:  R anterior   Treatment method:  Anterior pack (Rhino Rocket) Post-procedure details:    Assessment:  Bleeding decreased   Procedure completion:  Tolerated Comments:     Initially tried a nasal clip for 20 minutes without stopping bleeding.  Then trialed for afrin soaked gauze.  Then tried Afrin plus TXA soaked gauze.  Still continued to have bleeding.  Placed a anterior pack with a Rhino Rocket.  Given patient did not tolerate the [employer] very well.  I proceeded to inspect the area and the bleeding had seemed to mostly stop there was a very slight ooze.  We discussed with patient about not putting anything in versus trying some and like Merocel that would probably be a little bit more comfortable.  Patient was okay to proceed with the Merocel.     ____________________________________________   INITIAL IMPRESSION / ASSESSMENT AND PLAN / ED COURSE  Cynthia Simon was evaluated in Emergency Department on 02/10/2020 for the symptoms described in the history of present illness. She was evaluated in the  context of the global COVID-19 pandemic, which necessitated consideration that the patient might be at risk for infection with the SARS-CoV-2 virus that causes COVID-19. Institutional protocols and algorithms that pertain to the evaluation of patients at risk for COVID-19 are in a state of rapid change based on information released by regulatory bodies including the CDC and federal and state organizations. These policies and algorithms were followed during the patient's care in the ED.    Patient is a 87 year old female who comes in for nasal bleed.  Patient appears to be mostly bleeding out of her right nostril.  Difficult to really visualize where the bleed is coming from given the amount of clots in her nose.  It appears to be anterior and venous.  Multiple measures were taken to try to stop the bleeding as in the procedure note.  Eventually a anterior pack via a Rhino Rocket was placed.  I discussed with Dr. Jenne Campus who stated that patient can be seen on Monday in the office.  He recommended trying to hold warfarin.  Discussed with patient and she is only on warfarin for her A. fib.  We discussed the risk of stroke and she is okay with holding it.  I also discussed with pharmacy and will give 1 dose of vitamin K 2.5 mg oral given patient continues to ooze despite multiple interventions.  5:42 PM merocel was placed in the right nostril given patient did not tolerate the [employer] very well.    Unfortunately patient continued to ooze with the Merisel and regardless of placing TXA there.    We discussed the different options including a Rhino Rocket.  They are okay with proceeding with a Rhino Rocket.  Patient expressed understanding.  Discussed with Dr. Jenne Campus and they will follow-up with patient on Monday.  They will hold her warfarin for the next few days.  7:08 PM rhinorocket placed.  We discussed the risk including abrasion on the nose.  When the Rhino rocket had fallen out it was at 10 cc of  air.  This time inflated to 15 cc of air.  Patient did not tolerate any additional air in the cuff  7:40 PM bleeding has completely stopped.  Is no continuous oozing.  No posterior bleeding noted.   Discussed with family and they feel comfortable going home at this point.  They understand that they should follow-up with Dr. Jenne CampusMcqueen.  They are going to talk to the cardiologist about holding the warfarin.  Will prescribe 5 days worth of antibiotics given the residual packing.  Patient was ambulatory without symptoms of anemia.  Although she had some residual bleeding it was very small and I doubt that she lost significant blood.  Do not think we need to recheck her hemoglobin at this time.  I discussed the provisional nature of ED diagnosis, the treatment so far, the ongoing plan of care, follow up appointments and return precautions with the patient and any family or support people present. They expressed understanding and agreed with the plan, discharged home.   ____________________________________________   FINAL CLINICAL IMPRESSION(S) / ED DIAGNOSES   Final diagnoses:  Epistaxis      MEDICATIONS GIVEN DURING THIS VISIT:  Medications  tranexamic acid (CYKLOKAPRON) 1000 MG/10ML topical solution 500 mg (500 mg Topical Given 02/10/20 1534)  phytonadione (VITAMIN K) tablet 2.5 mg (2.5 mg Oral Given 02/10/20 1738)  lidocaine (XYLOCAINE) 2 % viscous mouth solution 15 mL (15 mLs Mouth/Throat Given 02/10/20 1737)     ED Discharge Orders         Ordered    amoxicillin-clavulanate (AUGMENTIN) 875-125 MG tablet  2 times daily        02/10/20 1807           Note:  This document was prepared using Dragon voice recognition software and may include unintentional dictation errors.   Concha SeFunke, Graeson Nouri E, MD 02/10/20 1944    Concha SeFunke, Boss Danielsen E, MD 02/10/20 1950    Concha SeFunke, Bobbye Petti E, MD 02/10/20 2016    Concha SeFunke, Partick Musselman E, MD 02/10/20 2018

## 2020-02-10 NOTE — ED Notes (Signed)
Pt calm , collective , denied pain or sob  

## 2020-02-10 NOTE — ED Notes (Signed)
MD at bedside. 

## 2020-02-10 NOTE — Discharge Instructions (Addendum)
Schedule an appointment for next Monday to have your packing removed Hold your warfarin if possible until then if it is okay with your primary care doctor.  There will be a small risk for stroke off your warfarin.  Take the antibiotics to prevent infection.  Do not remove the packing.  If you develop recurrent issues please return to the ER

## 2020-02-10 NOTE — ED Triage Notes (Signed)
Pt to ED via [facility] from home for chief complaint of nosebleed for 30 minutes. Reports changing warfarin yesterday, believes it was decreased.  Alert and oriented, denies pain.  Hx HTN Bleeding noted to right nostril, clamp applied, afrin given by EMS x4

## 2020-03-09 DIAGNOSIS — E538 Deficiency of other specified B group vitamins: Secondary | ICD-10-CM | POA: Diagnosis present

## 2020-07-15 ENCOUNTER — Encounter: Payer: Self-pay | Admitting: Ophthalmology

## 2020-07-20 NOTE — Anesthesia Preprocedure Evaluation (Addendum)
Anesthesia Evaluation  Patient identified by MRN, date of birth, ID band Patient awake    Reviewed: Allergy & Precautions, NPO status , Patient's Chart, lab work & pertinent test results  History of Anesthesia Complications Negative for: history of anesthetic complications  Airway Mallampati: I   Neck ROM: Full    Dental   Crowns :   Pulmonary neg pulmonary ROS,    Pulmonary exam normal breath sounds clear to auscultation       Cardiovascular hypertension, + CAD (s/p MI and stent) and + Peripheral Vascular Disease  + dysrhythmias (a fib on warfarin) + Valvular Problems/Murmurs (pulmonary HTN) MVP  Rhythm:Irregular Rate:Normal     Neuro/Psych HOH    GI/Hepatic negative GI ROS,   Endo/Other  negative endocrine ROS  Renal/GU negative Renal ROS     Musculoskeletal  (+) Arthritis ,   Abdominal   Peds  Hematology negative hematology ROS (+)   Anesthesia Other Findings Cardiology note 05/19/20:  87 year old female with known coronary disease, status post non STEMI and stent proximal RCA, currently without chest pain. Stress echocardiogram in 2017 revealed normal left ventricular function, without evidence for ischemia with severe mitral and tricuspid regurgitation. Patient has paroxysmal atrial fibrillation, chads vasc score 5, on warfarin. The patient has essential hypertension, blood pressure well controlled on current BP medications. Patient has hyperlipidemia with excellent control of LDL cholesterol on simvastatin.   Plan   1. Continue current medications 2. Continue warfarin for stroke prevention, target INR 2.0-3.0 3. Counseled patient about low-sodium diet 4. Dash diet printed instructions given to the patient 5. Counseled patient about low-cholesterol diet 6. Continue simvastatin for hyperlipidemia management 7. Heart healthy diet printed instructions given to the patient 8. 2D echocardiogram for surveillance  of severe mitral and tricuspid regurgitation 9. Return to clinic for follow-up in 3 months    Reproductive/Obstetrics                            Anesthesia Physical Anesthesia Plan  ASA: 3  Anesthesia Plan: MAC   Post-op Pain Management:    Induction: Intravenous  PONV Risk Score and Plan: 2 and TIVA, Midazolam and Treatment may vary due to age or medical condition  Airway Management Planned: Nasal Cannula  Additional Equipment:   Intra-op Plan:   Post-operative Plan:   Informed Consent: I have reviewed the patients History and Physical, chart, labs and discussed the procedure including the risks, benefits and alternatives for the proposed anesthesia with the patient or authorized representative who has indicated his/her understanding and acceptance.       Plan Discussed with: CRNA  Anesthesia Plan Comments:        Anesthesia Quick Evaluation

## 2020-07-27 ENCOUNTER — Other Ambulatory Visit: Payer: Self-pay

## 2020-07-27 ENCOUNTER — Ambulatory Visit: Payer: Medicare Other | Admitting: Anesthesiology

## 2020-07-27 ENCOUNTER — Encounter: Payer: Self-pay | Admitting: Ophthalmology

## 2020-07-27 ENCOUNTER — Ambulatory Visit
Admission: RE | Admit: 2020-07-27 | Discharge: 2020-07-27 | Disposition: A | Discharge status: Home / Self Care | Payer: Medicare Other | Attending: Ophthalmology | Admitting: Ophthalmology

## 2020-07-27 ENCOUNTER — Encounter
Admission: RE | Disposition: A | Discharge status: Home / Self Care | Payer: Self-pay | Source: Home / Self Care | Attending: Ophthalmology

## 2020-07-27 DIAGNOSIS — I2510 Atherosclerotic heart disease of native coronary artery without angina pectoris: Secondary | ICD-10-CM | POA: Diagnosis not present

## 2020-07-27 DIAGNOSIS — Z888 Allergy status to other drugs, medicaments and biological substances status: Secondary | ICD-10-CM | POA: Insufficient documentation

## 2020-07-27 DIAGNOSIS — Z79899 Other long term (current) drug therapy: Secondary | ICD-10-CM | POA: Insufficient documentation

## 2020-07-27 DIAGNOSIS — I4891 Unspecified atrial fibrillation: Secondary | ICD-10-CM | POA: Diagnosis not present

## 2020-07-27 DIAGNOSIS — I252 Old myocardial infarction: Secondary | ICD-10-CM | POA: Diagnosis not present

## 2020-07-27 DIAGNOSIS — Z791 Long term (current) use of non-steroidal anti-inflammatories (NSAID): Secondary | ICD-10-CM | POA: Insufficient documentation

## 2020-07-27 DIAGNOSIS — Z7901 Long term (current) use of anticoagulants: Secondary | ICD-10-CM | POA: Insufficient documentation

## 2020-07-27 DIAGNOSIS — Z7982 Long term (current) use of aspirin: Secondary | ICD-10-CM | POA: Diagnosis not present

## 2020-07-27 DIAGNOSIS — I341 Nonrheumatic mitral (valve) prolapse: Secondary | ICD-10-CM | POA: Insufficient documentation

## 2020-07-27 DIAGNOSIS — H2511 Age-related nuclear cataract, right eye: Secondary | ICD-10-CM | POA: Diagnosis not present

## 2020-07-27 HISTORY — PX: CATARACT EXTRACTION W/PHACO: SHX586

## 2020-07-27 HISTORY — DX: Gastro-esophageal reflux disease without esophagitis: K21.9

## 2020-07-27 HISTORY — DX: Presence of external hearing-aid: Z97.4

## 2020-07-27 SURGERY — PHACOEMULSIFICATION, CATARACT, WITH IOL INSERTION
Anesthesia: Monitor Anesthesia Care | Site: Eye | Laterality: Right

## 2020-07-27 MED ORDER — LIDOCAINE HCL (PF) 2 % IJ SOLN
INTRAOCULAR | Status: DC | PRN
  Administered 2020-07-27: 1 mL

## 2020-07-27 MED ORDER — TETRACAINE HCL 0.5 % OP SOLN
1.0000 [drp] | OPHTHALMIC | Status: DC | PRN
  Administered 2020-07-27 (×3): 1 [drp] via OPHTHALMIC

## 2020-07-27 MED ORDER — FENTANYL CITRATE (PF) 100 MCG/2ML IJ SOLN
INTRAMUSCULAR | Status: DC | PRN
  Administered 2020-07-27: 50 ug via INTRAVENOUS

## 2020-07-27 MED ORDER — SIGHTPATH DOSE#1 BSS IO SOLN
INTRAOCULAR | Status: DC | PRN
  Administered 2020-07-27: 62 mL via OPHTHALMIC

## 2020-07-27 MED ORDER — BRIMONIDINE TARTRATE-TIMOLOL 0.2-0.5 % OP SOLN
OPHTHALMIC | Status: DC | PRN
  Administered 2020-07-27: 1 [drp] via OPHTHALMIC

## 2020-07-27 MED ORDER — LACTATED RINGERS IV SOLN: INTRAVENOUS | Status: DC

## 2020-07-27 MED ORDER — CYCLOPENTOLATE HCL 2 % OP SOLN
1.0000 [drp] | OPHTHALMIC | Status: AC
  Administered 2020-07-27 (×3): 1 [drp] via OPHTHALMIC

## 2020-07-27 MED ORDER — MOXIFLOXACIN HCL 0.5 % OP SOLN
OPHTHALMIC | Status: DC | PRN
  Administered 2020-07-27: 0.2 mL via OPHTHALMIC

## 2020-07-27 MED ORDER — PHENYLEPHRINE HCL 10 % OP SOLN
1.0000 [drp] | OPHTHALMIC | Status: AC
  Administered 2020-07-27 (×3): 1 [drp] via OPHTHALMIC

## 2020-07-27 MED ORDER — MIDAZOLAM HCL 2 MG/2ML IJ SOLN
INTRAMUSCULAR | Status: DC | PRN
  Administered 2020-07-27: 1 mg via INTRAVENOUS

## 2020-07-27 MED ORDER — SIGHTPATH DOSE#1 NA CHONDROIT SULF-NA HYALURON 40-17 MG/ML IO SOLN
INTRAOCULAR | Status: DC | PRN
  Administered 2020-07-27: 1 mL via INTRAOCULAR

## 2020-07-27 SURGICAL SUPPLY — 15 items
CANNULA ANT/CHMB 27GA (MISCELLANEOUS) ×6 IMPLANT
GLOVE SURG ENC TEXT LTX SZ8 (GLOVE) ×3 IMPLANT
GLOVE SURG TRIUMPH 8.0 PF LTX (GLOVE) ×3 IMPLANT
GOWN STRL REUS W/ TWL LRG LVL3 (GOWN DISPOSABLE) ×2 IMPLANT
GOWN STRL REUS W/TWL LRG LVL3 (GOWN DISPOSABLE) ×6
LENS IOL TECNIS EYHANCE 21.5 (Intraocular Lens) ×3 IMPLANT
MARKER SKIN DUAL TIP RULER LAB (MISCELLANEOUS) ×3 IMPLANT
NEEDLE FILTER BLUNT 18X 1/2SAF (NEEDLE) ×2
NEEDLE FILTER BLUNT 18X1 1/2 (NEEDLE) ×1 IMPLANT
PACK EYE AFTER SURG (MISCELLANEOUS) ×3 IMPLANT
SYR 3ML LL SCALE MARK (SYRINGE) ×3 IMPLANT
SYR TB 1ML LUER SLIP (SYRINGE) ×3 IMPLANT
TIP ITREPID SGL USE BENT I/A (SUCTIONS) ×3 IMPLANT
WATER STERILE IRR 250ML POUR (IV SOLUTION) ×3 IMPLANT
WIPE NON LINTING 3.25X3.25 (MISCELLANEOUS) ×3 IMPLANT

## 2020-07-27 NOTE — H&P (Signed)
[facility]   Primary Care Physician:  Danella Penton, MD Ophthalmologist: Dr. Druscilla Brownie  Pre-Procedure History & Physical: HPI:  Cynthia Simon is a 87 y.o. female here for cataract surgery.   Past Medical History:  Diagnosis Date   Arthritis    Carotid artery disease (HCC)    BLOCKAGE   Coronary artery disease    Dysrhythmia    AFIB   GERD (gastroesophageal reflux disease)    HOH (hard of hearing)    Hypertension    MVP (mitral valve prolapse)    Myocardial infarction [facility])    MILD   Palpitations    Wears hearing aid in both ears     Past Surgical History:  Procedure Laterality Date   BREAST SURGERY Left    BIOPSY   CATARACT EXTRACTION W/PHACO Left 01/18/2016   Procedure: CATARACT EXTRACTION PHACO AND INTRAOCULAR LENS PLACEMENT (IOC);  Surgeon: Galen Manila, MD;  Location: [facility] ORS;  Service: Ophthalmology;  Laterality: Left;  PACK FUX:[id number] H US:01:40 AP:59.4 CDE:26.44   CORONARY ANGIOPLASTY     STENT   DILATION AND CURETTAGE OF UTERUS      Prior to Admission medications   Medication Sig Start Date End Date Taking? Authorizing Provider  Aflibercept 2 MG/0.05ML SOLN by Intravitreal route every 6 (six) weeks.   Yes [provider]  aspirin EC 81 MG tablet Take 81 mg by mouth daily with lunch.   Yes [provider]  cyanocobalamin 1000 MCG tablet Take 1,000 mcg by mouth daily.   Yes [provider]  famotidine (PEPCID) 20 MG tablet Take 20 mg by mouth at bedtime.   Yes [provider]  furosemide (LASIX) 20 MG tablet Take 20 mg by mouth daily. 12/08/19  Yes [provider]  isosorbide mononitrate (IMDUR) 30 MG 24 hr tablet Take 30 mg by mouth daily.   Yes [provider]  losartan (COZAAR) 25 MG tablet Take 25 mg by mouth every evening.   Yes [provider]  meloxicam (MOBIC) 7.5 MG tablet Take 7.5 mg by mouth daily.   Yes [provider]  metoprolol tartrate (LOPRESSOR) 25 MG  tablet Take 25 mg by mouth 2 (two) times daily.   Yes [provider]  pantoprazole (PROTONIX) 40 MG tablet Take 40 mg by mouth daily.   Yes [provider]  potassium chloride (KLOR-CON) 8 MEQ tablet Take 8 mEq by mouth daily with lunch.   Yes [provider]  simvastatin (ZOCOR) 10 MG tablet Take 10 mg by mouth at bedtime.    Yes [provider]  traMADol (ULTRAM) 50 MG tablet Take 50 mg by mouth 2 (two) times daily as needed for moderate pain.   Yes [provider]  warfarin (COUMADIN) 5 MG tablet Take 2.5-5 mg by mouth See admin instructions. Take  tablet (2.5mg ) by mouth every Tuesday, Wednesday, Thursday, Friday, Saturday and Sunday evening and take 1 tablet (5mg ) by mouth every Monday evening (07/15/20 - 1 tablet on Wed and Sat. 1/2 tab other days)   Yes [provider]    Allergies as of 07/01/2020 - Review Complete 02/10/2020  Allergen Reaction Noted   Amlodipine besylate Swelling 01/12/2016    History reviewed. No pertinent family history.  Social History   Socioeconomic History   Marital status: Widowed    Spouse name: Not on file   Number of children: Not on file   Years of education: Not on file   Highest education level: Not on file  Occupational History   Not on file  Tobacco Use   Smoking status: Never   Smokeless tobacco: Never  Vaping Use   Vaping Use: Never used  Substance and Sexual Activity   Alcohol use: No   Drug use: No   Sexual activity: Not on file  Other Topics Concern   Not on file  Social History Narrative   Not on file   Social Determinants of Health   Financial Resource Strain: Not on file  Food Insecurity: Not on file  Transportation Needs: Not on file  Physical Activity: Not on file  Stress: Not on file  Social Connections: Not on file  Intimate Partner Violence: Not on file    Review of Systems: See HPI, otherwise negative ROS  Physical Exam: BP (!) 178/86   Pulse 79   Temp  (!) 97.4 F (36.3 C) (Temporal)   Ht 5\' 4"  (1.626 m)   Wt 52.2 kg   SpO2 92%   BMI 19.74 kg/m  General:   Alert,  pleasant and cooperative in NAD Head:  Normocephalic and atraumatic. Respiratory:  Normal work of breathing. Cardiovascular:  RRR  Impression/Plan: Cynthia Simon is here for cataract surgery.  Risks, benefits, limitations, and alternatives regarding cataract surgery have been reviewed with the patient.  Questions have been answered.  All parties agreeable.   Dorise Hiss, MD  07/27/2020, 11:43 AM

## 2020-07-27 NOTE — Op Note (Signed)
PREOPERATIVE DIAGNOSIS:  Nuclear sclerotic cataract of the right eye.   POSTOPERATIVE DIAGNOSIS:  H25.11 Cataract   OPERATIVE PROCEDURE:ORPROCALL@   SURGEON:  Galen Manila, MD.   ANESTHESIA:  Anesthesiologist: Reed Breech, MD CRNA: Maree Krabbe, CRNA  1.      Managed anesthesia care. 2.      0.46ml of Shugarcaine was instilled in the eye following the paracentesis.   COMPLICATIONS:  None.   TECHNIQUE:   Stop and chop   DESCRIPTION OF PROCEDURE:  The patient was examined and consented in the preoperative holding area where the aforementioned topical anesthesia was applied to the right eye and then brought back to the Operating Room where the right eye was prepped and draped in the usual sterile ophthalmic fashion and a lid speculum was placed. A paracentesis was created with the side port blade and the anterior chamber was filled with viscoelastic. A near clear corneal incision was performed with the steel keratome. A continuous curvilinear capsulorrhexis was performed with a cystotome followed by the capsulorrhexis forceps. Hydrodissection and hydrodelineation were carried out with BSS on a blunt cannula. The lens was removed in a stop and chop  technique and the remaining cortical material was removed with the irrigation-aspiration handpiece. The capsular bag was inflated with viscoelastic and the Technis [id number]  lens was placed in the capsular bag without complication. The remaining viscoelastic was removed from the eye with the irrigation-aspiration handpiece. The wounds were hydrated. The anterior chamber was flushed with BSS and the eye was inflated to physiologic pressure. 0.79ml of Vigamox was placed in the anterior chamber. The wounds were found to be water tight. The eye was dressed with Combigan. The patient was given protective glasses to wear throughout the day and a shield with which to sleep tonight. The patient was also given drops with which to begin a drop regimen today and  will follow-up with me in one day. Implant Name Type Inv. Item Serial No. Manufacturer Lot No. LRB No. Used Action  LENS IOL TECNIS EYHANCE 21.5 - [id number] Intraocular Lens LENS IOL TECNIS EYHANCE 21.5 [id number] JOHNSON   Right 1 Implanted   Procedure(s): CATARACT EXTRACTION PHACO AND INTRAOCULAR LENS PLACEMENT (IOC) RIGHT 8.08 00:46.1 (Right)  Electronically signed: Galen Manila 07/27/2020 12:10 PM

## 2020-07-27 NOTE — Anesthesia Postprocedure Evaluation (Signed)
Anesthesia Post Note  Patient: Cynthia Simon  Procedure(s) Performed: CATARACT EXTRACTION PHACO AND INTRAOCULAR LENS PLACEMENT (IOC) RIGHT 8.08 00:46.1 (Right: Eye)     Patient location during evaluation: PACU Anesthesia Type: MAC Level of consciousness: awake and alert, oriented and patient cooperative Pain management: pain level controlled Vital Signs Assessment: post-procedure vital signs reviewed and stable Respiratory status: spontaneous breathing, nonlabored ventilation and respiratory function stable Cardiovascular status: blood pressure returned to baseline and stable Postop Assessment: adequate PO intake Anesthetic complications: no   No notable events documented.  Darrin Nipper

## 2020-07-27 NOTE — Transfer of Care (Signed)
Immediate Anesthesia Transfer of Care Note  Patient: Cynthia Simon  Procedure(s) Performed: CATARACT EXTRACTION PHACO AND INTRAOCULAR LENS PLACEMENT (IOC) RIGHT 8.08 00:46.1 (Right: Eye)  Patient Location: PACU  Anesthesia Type: MAC  Level of Consciousness: awake, alert  and patient cooperative  Airway and Oxygen Therapy: Patient Spontanous Breathing and Patient connected to supplemental oxygen  Post-op Assessment: Post-op Vital signs reviewed, Patient's Cardiovascular Status Stable, Respiratory Function Stable, Patent Airway and No signs of Nausea or vomiting  Post-op Vital Signs: Reviewed and stable  Complications: No notable events documented.

## 2020-07-27 NOTE — Anesthesia Procedure Notes (Signed)
Procedure Name: MAC Date/Time: 07/27/2020 11:51 AM Performed by: Cameron Ali, CRNA Pre-anesthesia Checklist: Patient identified, Emergency Drugs available, Suction available, Timeout performed and Patient being monitored Patient Re-evaluated:Patient Re-evaluated prior to induction Oxygen Delivery Method: Nasal cannula Placement Confirmation: positive ETCO2

## 2020-08-04 ENCOUNTER — Encounter: Payer: Self-pay | Admitting: Ophthalmology

## 2020-12-08 ENCOUNTER — Encounter: Payer: Self-pay | Admitting: Ophthalmology

## 2021-01-14 IMAGING — CR DG CHEST 2V
2 series · 2 of 2 positions shown · non-contrast
Comparison: 11/29/2017

CLINICAL DATA: 87-year-old female with a history of shortness of
breath and fever

EXAM:
CHEST - 2 VIEW

[chest pa]
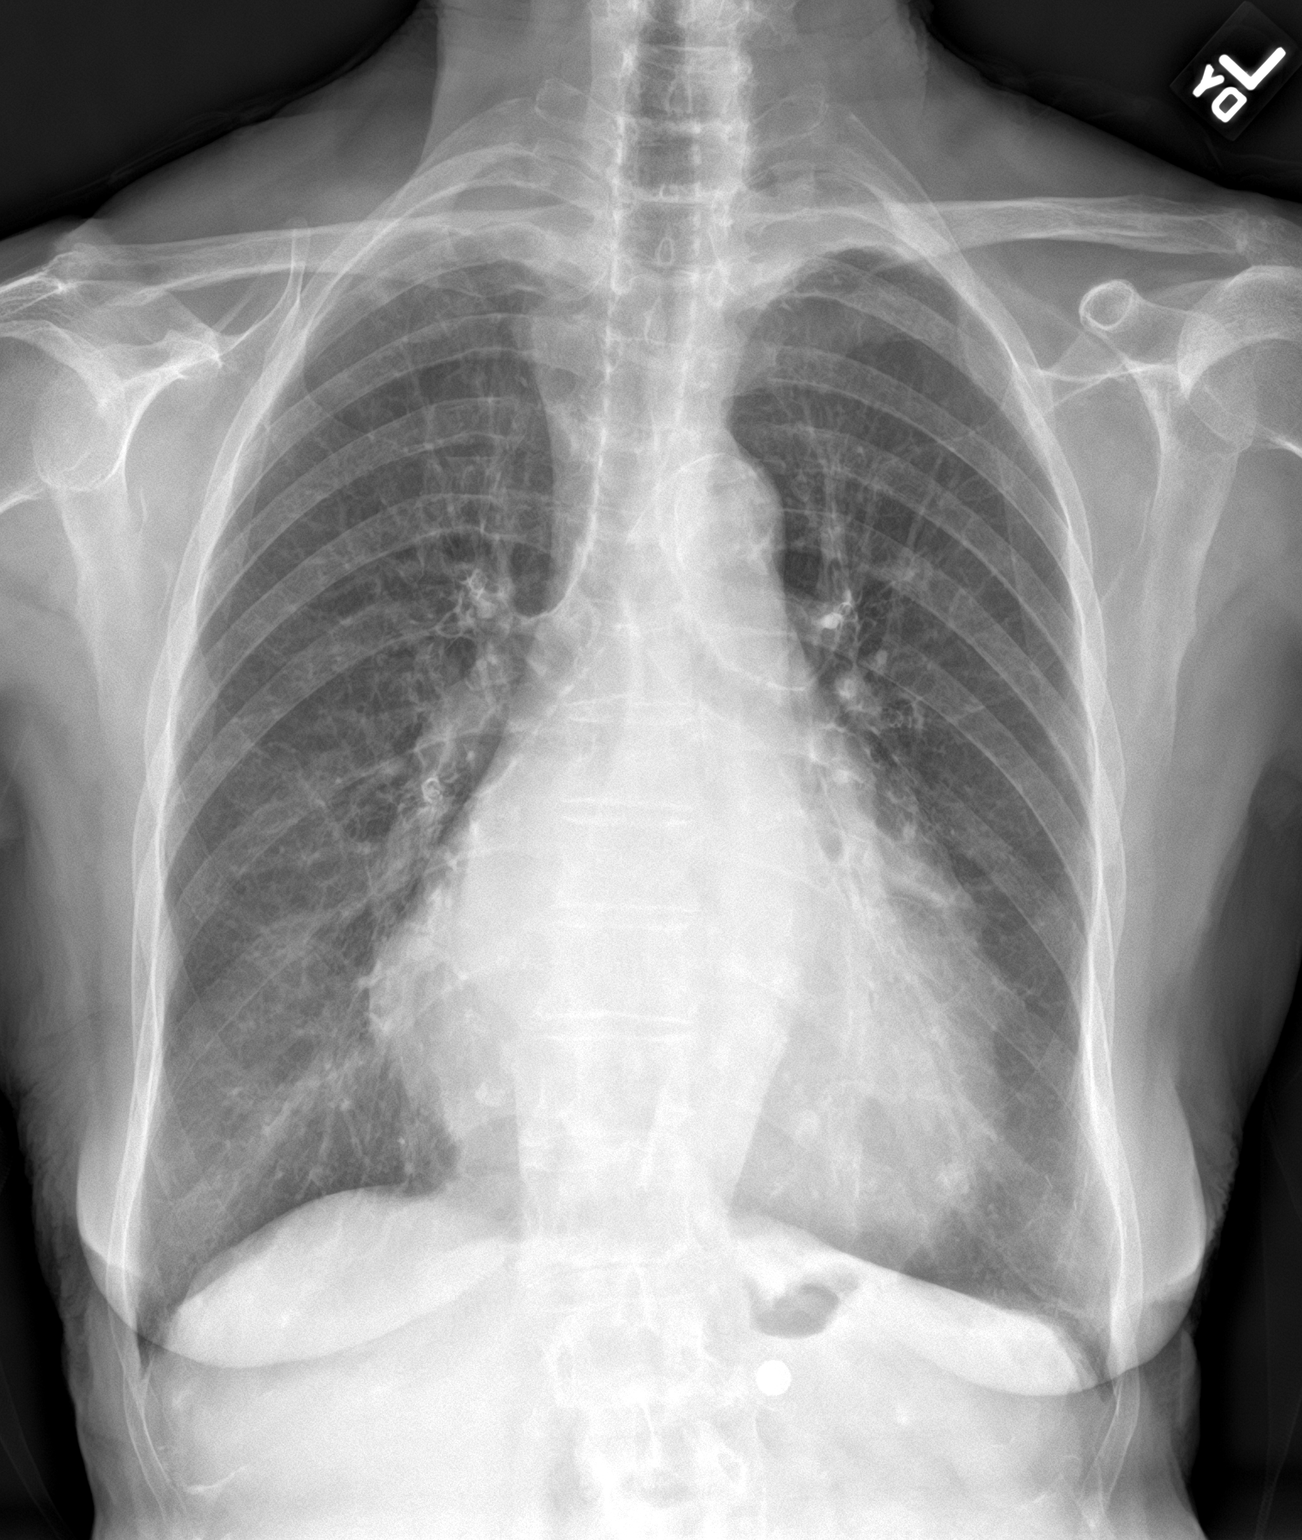

[chest lat]
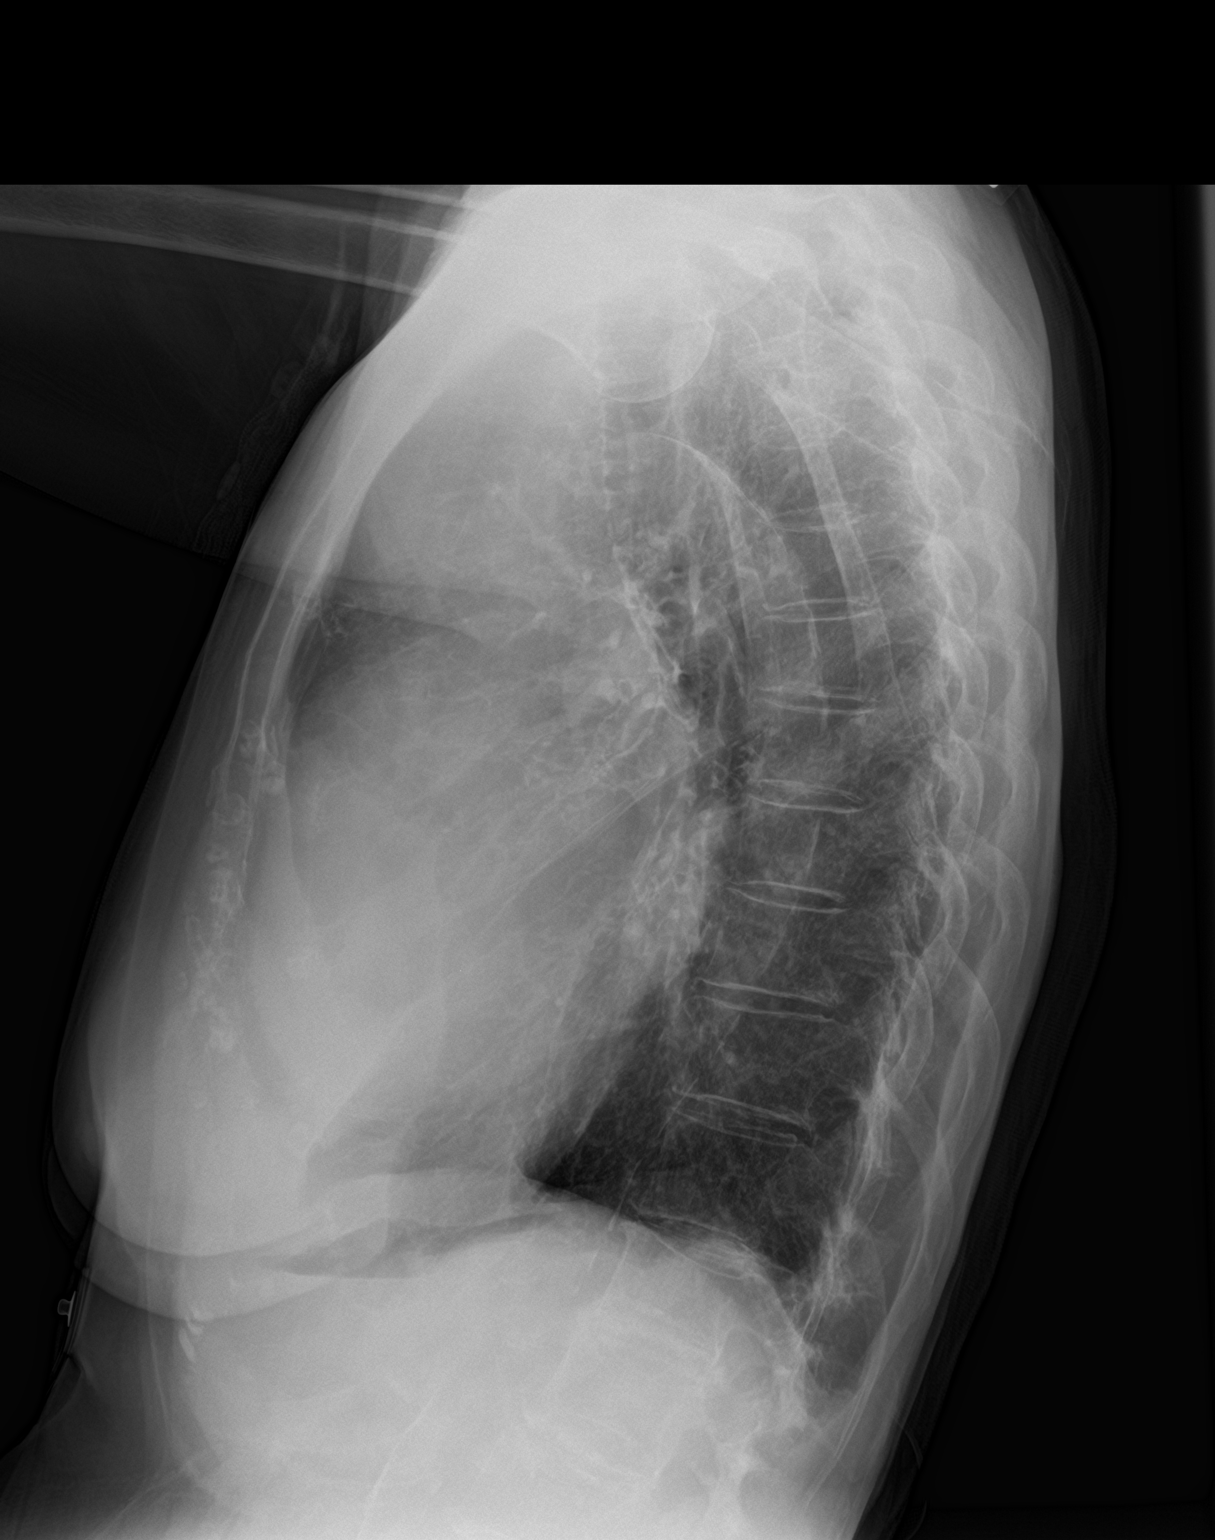

[2 of 2 positions shown; findings below may reference images not displayed]

FINDINGS: Cardiomediastinal silhouette unchanged in size and contour. No
pneumothorax or pleural effusion. No confluent airspace disease.
Coarsened interstitial markings bilateral lungs. Pleuroparenchymal
thickening at the apices is unchanged stigmata of emphysema, with
increased retrosternal airspace, flattened hemidiaphragms, increased
AP diameter, and hyperinflation on the AP view.

Degenerative changes the spine.  No acute displaced fracture.
IMPRESSION: Chronic lung changes and emphysema without evidence of acute
cardiopulmonary disease

## 2021-07-24 ENCOUNTER — Emergency Department: Payer: Medicare Other

## 2021-07-24 ENCOUNTER — Emergency Department
Admission: EM | Admit: 2021-07-24 | Discharge: 2021-07-24 | Disposition: A | Discharge status: Home / Self Care | Payer: Medicare Other | Attending: Student in an Organized Health Care Education/Training Program | Admitting: Student in an Organized Health Care Education/Training Program

## 2021-07-24 ENCOUNTER — Other Ambulatory Visit: Payer: Self-pay

## 2021-07-24 ENCOUNTER — Encounter: Payer: Self-pay | Admitting: Emergency Medicine

## 2021-07-24 DIAGNOSIS — M25562 Pain in left knee: Secondary | ICD-10-CM | POA: Diagnosis present

## 2021-07-24 DIAGNOSIS — I4891 Unspecified atrial fibrillation: Secondary | ICD-10-CM | POA: Insufficient documentation

## 2021-07-24 DIAGNOSIS — I10 Essential (primary) hypertension: Secondary | ICD-10-CM | POA: Insufficient documentation

## 2021-07-24 DIAGNOSIS — Z7901 Long term (current) use of anticoagulants: Secondary | ICD-10-CM | POA: Insufficient documentation

## 2021-07-24 LAB — COMPREHENSIVE METABOLIC PANEL
ALT: 13 U/L (ref 0–44)
AST: 18 U/L (ref 15–41)
Albumin: 3.9 g/dL (ref 3.5–5.0)
Alkaline Phosphatase: 60 U/L (ref 38–126)
Anion gap: 6 (ref 5–15)
BUN: 21 mg/dL (ref 8–23)
CO2: 25 mmol/L (ref 22–32)
Calcium: 9.5 mg/dL (ref 8.9–10.3)
Chloride: 104 mmol/L (ref 98–111)
Creatinine, Ser: 0.81 mg/dL (ref 0.44–1.00)
GFR, Estimated: >60 mL/min (ref >60)
Glucose, Bld: 104 mg/dL — ABNORMAL HIGH (ref 70–99)
Potassium: 4.3 mmol/L (ref 3.5–5.1)
Sodium: 135 mmol/L (ref 135–145)
Total Bilirubin: 1.3 mg/dL — ABNORMAL HIGH (ref 0.3–1.2)
Total Protein: 7.3 g/dL (ref 6.5–8.1)

## 2021-07-24 LAB — CBC WITH DIFFERENTIAL/PLATELET
Abs Immature Granulocytes: 0.05 10*3/uL (ref 0.00–0.07)
Basophils Absolute: 0.1 10*3/uL (ref 0.0–0.1)
Basophils Relative: 1 %
Eosinophils Absolute: 0.1 10*3/uL (ref 0.0–0.5)
Eosinophils Relative: 0 %
HCT: 36.4 % (ref 36.0–46.0)
Hemoglobin: 11.7 g/dL — ABNORMAL LOW (ref 12.0–15.0)
Immature Granulocytes: 0 %
Lymphocytes Relative: 10 %
Lymphs Abs: 1.3 10*3/uL (ref 0.7–4.0)
MCH: 30.3 pg (ref 26.0–34.0)
MCHC: 32.1 g/dL (ref 30.0–36.0)
MCV: 94.3 fL (ref 80.0–100.0)
Monocytes Absolute: 2.3 10*3/uL — ABNORMAL HIGH (ref 0.1–1.0)
Monocytes Relative: 17 %
Neutro Abs: 9.5 10*3/uL — ABNORMAL HIGH (ref 1.7–7.7)
Neutrophils Relative %: 72 %
Platelets: 243 10*3/uL (ref 150–400)
RBC: 3.86 MIL/uL — ABNORMAL LOW (ref 3.87–5.11)
RDW: 13.5 % (ref 11.5–15.5)
WBC: 13.2 10*3/uL — ABNORMAL HIGH (ref 4.0–10.5)
nRBC: 0 % (ref 0.0–0.2)

## 2021-07-24 LAB — PROTIME-INR
INR: 2.8 — ABNORMAL HIGH (ref 0.8–1.2)
Prothrombin Time: 29.4 seconds — ABNORMAL HIGH (ref 11.4–15.2)

## 2021-07-24 LAB — URIC ACID: Uric Acid, Serum: 5 mg/dL (ref 2.5–7.1)

## 2021-07-24 LAB — APTT: aPTT: 49 seconds — ABNORMAL HIGH (ref 24–36)

## 2021-07-24 MED ORDER — CEPHALEXIN 500 MG PO CAPS
500.0000 mg | ORAL_CAPSULE | Freq: Once | ORAL | Status: AC
  Administered 2021-07-24: 500 mg via ORAL
  Filled 2021-07-24: qty 1

## 2021-07-24 MED ORDER — CEPHALEXIN 500 MG PO CAPS: 500.0000 mg | ORAL_CAPSULE | Freq: Four times a day (QID) | ORAL | 0 refills | Status: AC

## 2021-07-24 NOTE — Discharge Instructions (Signed)
Take 2 tablets of Keflex in the morning and 2 tablets at night. Please make follow-up appointment with Dr. Joice Lofts.

## 2021-07-24 NOTE — ED Provider Notes (Signed)
[facility] Provider Note  Patient Contact: 3:19 PM (approximate)   History   Knee Pain   HPI  Cynthia Simon is a 88 y.o. female with a history of atrial fibrillation, gout, hypertension, arthritis currently anticoagulated with Coumadin, presents to the emergency department with some mild erythema of the left lower extremity that radiates along the inner aspect of patient's left thigh.  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  Patient states that she does have a history of gout but is never symptomatic in her left knee.  She denies a history of prior DVT or PE.  She denies fever and chills.  No falls or other mechanisms of trauma.  No prior history of left knee arthroplasty.  Patient states that she has still been able to bend and extend her left knee with some discomfort.      Physical Exam   Triage Vital Signs: ED Triage Vitals [07/24/21 1447]  Enc Vitals Group     BP (!) 129/47     Pulse Rate 69     Resp 20     Temp 98.2 F (36.8 C)     Temp Source Oral     SpO2 99 %     Weight 115 lb (52.2 kg)     Height 5\' 2"  (1.575 m)     Head Circumference      Peak Flow      Pain Score 3     Pain Loc      Pain Edu?      Excl. in GC?     Most recent vital signs: Vitals:   07/24/21 1447  BP: (!) 129/47  Pulse: 69  Resp: 20  Temp: 98.2 F (36.8 C)  SpO2: 99%     General: Alert and in no acute distress. Eyes:  PERRL. EOMI. Head: No acute traumatic findings ENT:      Nose: No congestion/rhinnorhea.      Mouth/Throat: Mucous membranes are moist.  Neck: No stridor. No cervical spine tenderness to palpation. Cardiovascular:  Good peripheral perfusion Respiratory: Normal respiratory effort without tachypnea or retractions. Lungs CTAB. Good air entry to the bases with no decreased or absent breath sounds. Gastrointestinal: Bowel sounds 4 quadrants.  Soft and nontender to palpation. No guarding or rigidity. No palpable masses. No distention. No CVA tenderness. Musculoskeletal: Full range of motion to all extremities. Patient's left calf is mildly tender to palpation.  Palpable dorsalis pedis pulse on the left. Neurologic:  No gross focal neurologic deficits are appreciated.  Skin:   No rash noted   ED Results / Procedures / Treatments   Labs (all labs ordered are listed, but only abnormal results are displayed) Labs Reviewed  CBC WITH DIFFERENTIAL/PLATELET - Abnormal; Notable for the following components:      Result Value   WBC 13.2 (*)    RBC 3.86 (*)    Hemoglobin 11.7 (*)    Neutro Abs 9.5 (*)    Monocytes Absolute 2.3 (*)    All other components within normal limits  COMPREHENSIVE METABOLIC PANEL - Abnormal; Notable for the following components:   Glucose, Bld 104 (*)    Total Bilirubin 1.3 (*)    All other components within normal limits  PROTIME-INR - Abnormal; Notable for the following components:   Prothrombin Time 29.4 (*)    INR 2.8 (*)    All other components  within normal limits  APTT - Abnormal; Notable for the following components:   aPTT 49 (*)    All other components within normal limits  URIC ACID       RADIOLOGY  I personally viewed and evaluated these images as part of my medical decision making, as well as reviewing the written report by the radiologist.  ED Provider Interpretation: Venous ultrasound showed no signs of DVT.  I agree with radiologist interpretation.   PROCEDURES:  Critical Care performed: No  Procedures   MEDICATIONS ORDERED IN ED: Medications  cephALEXin (KEFLEX) capsule 500 mg (has no administration in time range)     IMPRESSION / MDM / ASSESSMENT AND PLAN / ED COURSE  I reviewed the triage vital signs and the nursing notes.                              Assessment and plan Knee pain:  88 year old female presents to the emergency department with left knee  erythema and pain  Vital signs were reassuring at triage.  On exam, patient could flex and extend her left knee with some discomfort.  She had a negative ballottement and no deficits with provocative testing.  She did have some mild erythema overlying the inferior aspect of her left knee joint.  She also had some mild tenderness to palpation along the left calf.  Differential diagnosis includes DVT, cellulitis, gout, septic joint....  Patient had mildly elevated white blood cell count with left shift.  PT/INR therapeutic.  Patient had no signs of DVT on venous ultrasound.  CMP unremarkable.  Given mild overlying erythema, will treat patient with Keflex for possible cellulitis.  I also suspect that patient might have overuse injury from prolonged standing on Friday and did recommend follow-up with orthopedics.  Clinical Course as of 07/24/21 1743  Sun Jul 24, 2021  1737 AST: 18 [JW]    Clinical Course User Index [JW] Orvil Feil, [place]     FINAL CLINICAL IMPRESSION(S) / ED DIAGNOSES   Final diagnoses:  Acute pain of left knee     Rx / DC Orders   ED Discharge Orders          Ordered    cephALEXin (KEFLEX) 500 MG capsule  4 times daily        07/24/21 1739             Note:  This document was prepared using Dragon voice recognition software and may include unintentional dictation errors.   Pia Mau Ansley, PA-C 07/24/21 1743    Willy Eddy, MD 07/24/21 [phone]

## 2021-07-24 NOTE — ED Notes (Signed)
88 yom with a c/c of right sided knee pain since yesterday morning.

## 2022-08-17 ENCOUNTER — Observation Stay
Admission: EM | Admit: 2022-08-17 | Discharge: 2022-08-18 | Disposition: A | Discharge status: Home / Self Care | Payer: Medicare Other | Attending: Hospitalist | Admitting: Hospitalist

## 2022-08-17 ENCOUNTER — Emergency Department: Payer: Medicare Other

## 2022-08-17 ENCOUNTER — Other Ambulatory Visit: Payer: Self-pay

## 2022-08-17 DIAGNOSIS — R7989 Other specified abnormal findings of blood chemistry: Secondary | ICD-10-CM | POA: Insufficient documentation

## 2022-08-17 DIAGNOSIS — N179 Acute kidney failure, unspecified: Secondary | ICD-10-CM | POA: Diagnosis present

## 2022-08-17 DIAGNOSIS — I482 Chronic atrial fibrillation, unspecified: Secondary | ICD-10-CM | POA: Diagnosis not present

## 2022-08-17 DIAGNOSIS — Z955 Presence of coronary angioplasty implant and graft: Secondary | ICD-10-CM | POA: Insufficient documentation

## 2022-08-17 DIAGNOSIS — B9620 Unspecified Escherichia coli [E. coli] as the cause of diseases classified elsewhere: Secondary | ICD-10-CM | POA: Insufficient documentation

## 2022-08-17 DIAGNOSIS — Z7982 Long term (current) use of aspirin: Secondary | ICD-10-CM | POA: Insufficient documentation

## 2022-08-17 DIAGNOSIS — N3000 Acute cystitis without hematuria: Secondary | ICD-10-CM | POA: Insufficient documentation

## 2022-08-17 DIAGNOSIS — Z7901 Long term (current) use of anticoagulants: Secondary | ICD-10-CM | POA: Insufficient documentation

## 2022-08-17 DIAGNOSIS — R531 Weakness: Principal | ICD-10-CM | POA: Insufficient documentation

## 2022-08-17 DIAGNOSIS — N3090 Cystitis, unspecified without hematuria: Secondary | ICD-10-CM | POA: Diagnosis present

## 2022-08-17 DIAGNOSIS — Z79899 Other long term (current) drug therapy: Secondary | ICD-10-CM | POA: Insufficient documentation

## 2022-08-17 DIAGNOSIS — I10 Essential (primary) hypertension: Secondary | ICD-10-CM | POA: Diagnosis present

## 2022-08-17 DIAGNOSIS — I2510 Atherosclerotic heart disease of native coronary artery without angina pectoris: Secondary | ICD-10-CM | POA: Diagnosis not present

## 2022-08-17 DIAGNOSIS — I214 Non-ST elevation (NSTEMI) myocardial infarction: Secondary | ICD-10-CM | POA: Insufficient documentation

## 2022-08-17 DIAGNOSIS — E538 Deficiency of other specified B group vitamins: Secondary | ICD-10-CM | POA: Diagnosis present

## 2022-08-17 LAB — HEPATIC FUNCTION PANEL
ALT: 12 U/L (ref 0–44)
AST: 19 U/L (ref 15–41)
Albumin: 3.9 g/dL (ref 3.5–5.0)
Alkaline Phosphatase: 56 U/L (ref 38–126)
Bilirubin, Direct: 0.2 mg/dL (ref 0.0–0.2)
Indirect Bilirubin: 0.9 mg/dL (ref 0.3–0.9)
Total Bilirubin: 1.1 mg/dL (ref 0.3–1.2)
Total Protein: 6.8 g/dL (ref 6.5–8.1)

## 2022-08-17 LAB — URINALYSIS, ROUTINE W REFLEX MICROSCOPIC
Bilirubin Urine: NEGATIVE
Glucose, UA: NEGATIVE mg/dL
Hgb urine dipstick: NEGATIVE
Ketones, ur: 5 mg/dL — AB
Nitrite: POSITIVE — AB
Protein, ur: NEGATIVE mg/dL
Specific Gravity, Urine: 1.013 (ref 1.005–1.030)
pH: 5 (ref 5.0–8.0)

## 2022-08-17 LAB — BASIC METABOLIC PANEL
Anion gap: 8 (ref 5–15)
BUN: 20 mg/dL (ref 8–23)
CO2: 24 mmol/L (ref 22–32)
Calcium: 9.1 mg/dL (ref 8.9–10.3)
Chloride: 99 mmol/L (ref 98–111)
Creatinine, Ser: 1.11 mg/dL — ABNORMAL HIGH (ref 0.44–1.00)
GFR, Estimated: 48 mL/min — ABNORMAL LOW (ref >60)
Glucose, Bld: 116 mg/dL — ABNORMAL HIGH (ref 70–99)
Potassium: 4.8 mmol/L (ref 3.5–5.1)
Sodium: 131 mmol/L — ABNORMAL LOW (ref 135–145)

## 2022-08-17 LAB — CBC
HCT: 29.8 % — ABNORMAL LOW (ref 36.0–46.0)
Hemoglobin: 9.8 g/dL — ABNORMAL LOW (ref 12.0–15.0)
MCH: 31.6 pg (ref 26.0–34.0)
MCHC: 32.9 g/dL (ref 30.0–36.0)
MCV: 96.1 fL (ref 80.0–100.0)
Platelets: 246 10*3/uL (ref 150–400)
RBC: 3.1 MIL/uL — ABNORMAL LOW (ref 3.87–5.11)
RDW: 13.7 % (ref 11.5–15.5)
WBC: 11.1 10*3/uL — ABNORMAL HIGH (ref 4.0–10.5)
nRBC: 0 % (ref 0.0–0.2)

## 2022-08-17 LAB — PROTIME-INR
INR: 1.8 — ABNORMAL HIGH (ref 0.8–1.2)
Prothrombin Time: 20.8 seconds — ABNORMAL HIGH (ref 11.4–15.2)

## 2022-08-17 LAB — T4, FREE: Free T4: 1.32 ng/dL — ABNORMAL HIGH (ref 0.61–1.12)

## 2022-08-17 LAB — TROPONIN I (HIGH SENSITIVITY)
Troponin I (High Sensitivity): 119 ng/L (ref <18)
Troponin I (High Sensitivity): 108 ng/L (ref <18)

## 2022-08-17 LAB — APTT: aPTT: 34 seconds (ref 24–36)

## 2022-08-17 LAB — TSH: TSH: 1.071 u[IU]/mL (ref 0.350–4.500)

## 2022-08-17 MED ORDER — WARFARIN - PHARMACIST DOSING INPATIENT
Freq: Every day | Status: DC
  Filled 2022-08-17: qty 1

## 2022-08-17 MED ORDER — SODIUM CHLORIDE 0.9% FLUSH
3.0000 mL | Freq: Two times a day (BID) | INTRAVENOUS | Status: DC
  Administered 2022-08-17 – 2022-08-18 (×2): 3 mL via INTRAVENOUS

## 2022-08-17 MED ORDER — HYDRALAZINE HCL 20 MG/ML IJ SOLN: 5.0000 mg | Freq: Four times a day (QID) | INTRAMUSCULAR | Status: DC | PRN

## 2022-08-17 MED ORDER — SODIUM CHLORIDE 0.9 % IV BOLUS
500.0000 mL | Freq: Once | INTRAVENOUS | Status: AC
  Administered 2022-08-17: 500 mL via INTRAVENOUS

## 2022-08-17 MED ORDER — SIMVASTATIN 10 MG PO TABS
10.0000 mg | ORAL_TABLET | Freq: Every day | ORAL | Status: DC
  Administered 2022-08-17: 10 mg via ORAL
  Filled 2022-08-17: qty 1

## 2022-08-17 MED ORDER — SODIUM CHLORIDE 0.9 % IV SOLN
2.0000 g | Freq: Once | INTRAVENOUS | Status: AC
  Administered 2022-08-17: 2 g via INTRAVENOUS
  Filled 2022-08-17: qty 20

## 2022-08-17 MED ORDER — WARFARIN SODIUM 2.5 MG PO TABS
2.5000 mg | ORAL_TABLET | Freq: Once | ORAL | Status: AC
  Administered 2022-08-17: 2.5 mg via ORAL
  Filled 2022-08-17: qty 1

## 2022-08-17 MED ORDER — METOPROLOL TARTRATE 25 MG PO TABS
25.0000 mg | ORAL_TABLET | Freq: Two times a day (BID) | ORAL | Status: DC
  Administered 2022-08-17: 25 mg via ORAL
  Filled 2022-08-17 (×3): qty 1

## 2022-08-17 NOTE — ED Notes (Signed)
Troponin #2 drawn and sent to lab. Pt still does not need to void.

## 2022-08-17 NOTE — ED Notes (Signed)
Ambulated pt in the hall. Pt tolerated well. Assisted with 2 wheel walker. No complaints of dizziness.

## 2022-08-17 NOTE — H&P (Signed)
History and Physical    Patient: Cynthia Simon WNU:[id number] DOB: 11-04-1930 DOA: 08/17/2022 DOS: the patient was seen and examined on 08/18/2022 PCP: Danella Penton, MD  Patient coming from: Home   Chief Complaint:  Chief Complaint  Patient presents with   Dizziness   Fall    HPI: Cynthia Simon is a 89 y.o. female with medical history significant for heart disease, A-fib on Coumadin, hypertension presenting with dizziness lightheadedness .patient  states that she has macular degeneration and usually has blurred vision and dizziness but today was much worse.  Patient has to follow-up 11 days ago getting her head has some left arm bruising and skin tears at baseline ambulates with a walker.  Patient has a large hematoma to her left hip that is no longer painful.  For the last few days her dizziness is progressively getting worse and that is why patient came to the emergency room.  Patient did note some mild shortness of breath but no chest pain palpitations fevers chills nausea vomiting diarrhea or any other complaints. Initial vitals show patient has a heart rate of 54 blood pressure 144/112 with O2 sats of 98% temperature 98.3 percent. Initial blood work shows leukocytosis of 11.1 anemia of 9.8, mild AKI with a creatinine of 1.11 and sodium 131.  Urinalysis positive for nitrites and bacteria and few WBCs consistent with cystitis. EKG done in the emergency room showed A-fib with a slow ventricular response.  Patient has had A-fib chronically for several years and previous EKG as well shows heart rate that is in the 50s.  Today however EKG did show new changes of T wave inversions in the inferior and lateral leads along with an elevated troponin of 119.  With patient's presyncopal presentation shortness of breath and abnormal EKG cardiology is consulted for an NSTEMI. Started patient on Rocephin culture and her urinalysis. Review of Systems: Review of Systems  Eyes:  Positive for  blurred vision.  Neurological:  Positive for dizziness.    Past Medical History:  Diagnosis Date   Arthritis    Carotid artery disease (HCC)    BLOCKAGE   Coronary artery disease    Dysrhythmia    AFIB   GERD (gastroesophageal reflux disease)    HOH (hard of hearing)    Hypertension    MVP (mitral valve prolapse)    Myocardial infarction [facility])    MILD   Palpitations    Wears hearing aid in both ears    Past Surgical History:  Procedure Laterality Date   BREAST SURGERY Left    BIOPSY   CATARACT EXTRACTION W/PHACO Left 01/18/2016   Procedure: CATARACT EXTRACTION PHACO AND INTRAOCULAR LENS PLACEMENT (IOC);  Surgeon: Galen Manila, MD;  Location: [facility] ORS;  Service: Ophthalmology;  Laterality: Left;  PACK [id number] H US:01:40 AP:59.4 CDE:26.44   CATARACT EXTRACTION W/PHACO Right 07/27/2020   Procedure: CATARACT EXTRACTION PHACO AND INTRAOCULAR LENS PLACEMENT (IOC) RIGHT 8.08 00:46.1;  Surgeon: Galen Manila, MD;  Location: [facility];  Service: Ophthalmology;  Laterality: Right;   CORONARY ANGIOPLASTY     STENT   DILATION AND CURETTAGE OF UTERUS     Social History:  reports that she has never smoked. She has never used smokeless tobacco. She reports that she does not drink alcohol and does not use drugs.  Allergies  Allergen Reactions   Amlodipine Besylate Swelling    In feet and ankles   Percocet [Oxycodone-Acetaminophen] Nausea And Vomiting    No family history on file.  Prior to Admission medications   Medication Sig Start Date End Date Taking? Authorizing Provider  Aflibercept 2 MG/0.05ML SOLN by Intravitreal route every 6 (six) weeks.    [provider]  aspirin EC 81 MG tablet Take 81 mg by mouth daily with lunch.    [provider]  cyanocobalamin 1000 MCG tablet Take 1,000 mcg by mouth daily.    [provider]  famotidine (PEPCID) 20 MG tablet Take 20 mg by mouth at bedtime.    [provider]  furosemide  (LASIX) 20 MG tablet Take 20 mg by mouth daily. 12/08/19   [provider]  isosorbide mononitrate (IMDUR) 30 MG 24 hr tablet Take 30 mg by mouth daily.    [provider]  losartan (COZAAR) 25 MG tablet Take 25 mg by mouth every evening.    [provider]  meloxicam (MOBIC) 7.5 MG tablet Take 7.5 mg by mouth daily.    [provider]  metoprolol tartrate (LOPRESSOR) 25 MG tablet Take 25 mg by mouth 2 (two) times daily.    [provider]  pantoprazole (PROTONIX) 40 MG tablet Take 40 mg by mouth daily.    [provider]  potassium chloride (KLOR-CON) 8 MEQ tablet Take 8 mEq by mouth daily with lunch.    [provider]  simvastatin (ZOCOR) 10 MG tablet Take 10 mg by mouth at bedtime.     [provider]  traMADol (ULTRAM) 50 MG tablet Take 50 mg by mouth 2 (two) times daily as needed for moderate pain.    [provider]  warfarin (COUMADIN) 5 MG tablet Take 2.5-5 mg by mouth See admin instructions. Take  tablet (2.5mg ) by mouth every Tuesday, Wednesday, Thursday, Friday, Saturday and Sunday evening and take 1 tablet (5mg ) by mouth every Monday evening (07/15/20 - 1 tablet on Wed and Sat. 1/2 tab other days)    [provider]    Vitals:   08/18/22 0100 08/18/22 0130 08/18/22 0200 08/18/22 0230  BP: (!) 147/42 (!) 149/66 (!) 149/58 131/68  Pulse: (!) 50 (!) 56 (!) 56 79  Resp: 15 14 12  (!) 23  Temp:    98.6 F (37 C)  TempSrc:    Oral  SpO2: 96% 98% 98% 100%  Weight:      Height:       Physical Exam Vitals and nursing note reviewed.  Constitutional:      General: She is not in acute distress. HENT:     Head: Normocephalic.     Right Ear: Hearing normal.     Left Ear: Hearing normal.     Nose: Nose normal. No nasal deformity.     Mouth/Throat:     Lips: Pink.     Tongue: No lesions.     Pharynx: Oropharynx is clear.  Eyes:     General: Lids are normal.     Extraocular Movements:  Extraocular movements intact.  Cardiovascular:     Rate and Rhythm: Normal rate and regular rhythm.     Heart sounds: Normal heart sounds.  Pulmonary:     Effort: Pulmonary effort is normal.     Breath sounds: Normal breath sounds.  Abdominal:     General: Bowel sounds are normal. There is no distension.     Palpations: Abdomen is soft. There is no mass.     Tenderness: There is no abdominal tenderness.  Musculoskeletal:     Right lower leg: No edema.     Left  lower leg: No edema.  Skin:    General: Skin is cool.     Capillary Refill: Capillary refill takes 2 to 3 seconds.     Comments: Multiple varicosities in both lower legs skin is cool.  Cap refills about 2 to 3 seconds.  Neurological:     General: No focal deficit present.     Mental Status: She is alert and oriented to person, place, and time.     Cranial Nerves: Cranial nerves 2-12 are intact.  Psychiatric:        Attention and Perception: Attention normal.        Mood and Affect: Mood normal.        Speech: Speech normal.        Behavior: Behavior normal. Behavior is cooperative.     Labs on Admission: I have personally reviewed following labs and imaging studies  CBC: Recent Labs  Lab 08/17/22 1311  WBC 11.1*  HGB 9.8*  HCT 29.8*  MCV 96.1  PLT 246   Basic Metabolic Panel: Recent Labs  Lab 08/17/22 1311  NA 131*  K 4.8  CL 99  CO2 24  GLUCOSE 116*  BUN 20  CREATININE 1.11*  CALCIUM 9.1   GFR: Estimated Creatinine Clearance: 28.3 mL/min (A) (by C-G formula based on SCr of 1.11 mg/dL (H)). Liver Function Tests: Recent Labs  Lab 08/17/22 1311  AST 19  ALT 12  ALKPHOS 56  BILITOT 1.1  PROT 6.8  ALBUMIN 3.9   No results for input(s): "LIPASE", "AMYLASE" in the last 168 hours. No results for input(s): "AMMONIA" in the last 168 hours. Coagulation Profile: Recent Labs  Lab 08/17/22 1311  INR 1.8*   Cardiac Enzymes: No results for input(s): "CKTOTAL", "CKMB", "CKMBINDEX", "TROPONINI" in the  last 168 hours. BNP (last 3 results) No results for input(s): "PROBNP" in the last 8760 hours. HbA1C: No results for input(s): "HGBA1C" in the last 72 hours. CBG: No results for input(s): "GLUCAP" in the last 168 hours. Lipid Profile: No results for input(s): "CHOL", "HDL", "LDLCALC", "TRIG", "CHOLHDL", "LDLDIRECT" in the last 72 hours. Thyroid Function Tests: Recent Labs    08/17/22 1950  TSH 1.071  FREET4 1.32*   Anemia Panel: No results for input(s): "VITAMINB12", "FOLATE", "FERRITIN", "TIBC", "IRON", "RETICCTPCT" in the last 72 hours. Urine analysis: Urinalysis    Component Value Date/Time   COLORURINE YELLOW (A) 08/17/2022 1750   APPEARANCEUR CLEAR (A) 08/17/2022 1750   LABSPEC 1.013 08/17/2022 1750   PHURINE 5.0 08/17/2022 1750   GLUCOSEU NEGATIVE 08/17/2022 1750   HGBUR NEGATIVE 08/17/2022 1750   BILIRUBINUR NEGATIVE 08/17/2022 1750   KETONESUR 5 (A) 08/17/2022 1750   PROTEINUR NEGATIVE 08/17/2022 1750   NITRITE POSITIVE (A) 08/17/2022 1750   LEUKOCYTESUR TRACE (A) 08/17/2022 1750    Unresulted Labs (From admission, onward)     Start     Ordered   08/18/22 0500  Comprehensive metabolic panel  Tomorrow morning,   R        08/17/22 1853   08/18/22 0500  CBC  Tomorrow morning,   R        08/17/22 1853   08/18/22 0500  Protime-INR  Tomorrow morning,   R        08/17/22 2138   08/17/22 1810  Urine Culture  Once,   URGENT       Question:  Indication  Answer:  Dysuria   08/17/22 1809           Radiological Exams on  Admission: DG HIP UNILAT WITH PELVIS 2-3 VIEWS LEFT  Result Date: 08/17/2022 CLINICAL DATA:  Status post fall. EXAM: DG HIP (WITH OR WITHOUT PELVIS) 2-3V LEFT COMPARISON:  None Available. FINDINGS: There is a thin, ill-defined curvilinear lucent and sclerotic area seen overlying the junction of the head and neck of the proximal left femur. This is of indeterminate age. There is no evidence of dislocation. Marked severity degenerative changes are seen  involving both hips in the form of joint space narrowing and acetabular sclerosis. IMPRESSION: Findings involving the proximal left femur, as described above, which may be chronic and/or degenerative in nature. Correlation with CT is recommended, as an acute fracture cannot completely be excluded. Electronically Signed   By: Aram Candela M.D.   On: 08/17/2022 18:49   DG Chest 2 View  Result Date: 08/17/2022 CLINICAL DATA:  Weakness EXAM: CHEST - 2 VIEW COMPARISON:  07/24/2019 FINDINGS: Gross cardiomegaly. Small, chronic loculated right pleural effusion. No acute airspace opacity. The visualized skeletal structures are unremarkable. IMPRESSION: Gross cardiomegaly. Small, chronic loculated right pleural effusion without acute abnormality of the lungs. Electronically Signed   By: Jearld Lesch M.D.   On: 08/17/2022 16:43   CT HEAD WO CONTRAST ( )  Result Date: 08/17/2022 CLINICAL DATA:  Fall 11 days ago, on blood thinner EXAM: CT HEAD WITHOUT CONTRAST CT CERVICAL SPINE WITHOUT CONTRAST TECHNIQUE: Multidetector CT imaging of the head and cervical spine was performed following the standard protocol without intravenous contrast. Multiplanar CT image reconstructions of the cervical spine were also generated. RADIATION DOSE REDUCTION: This exam was performed according to the departmental dose-optimization program which includes automated exposure control, adjustment of the mA and/or kV according to patient size and/or use of iterative reconstruction technique. COMPARISON:  None Available. FINDINGS: CT HEAD FINDINGS Brain: No evidence of acute infarction, hemorrhage, hydrocephalus, extra-axial collection or mass lesion/mass effect. Mild periventricular hypodensity. Vascular: No hyperdense vessel or unexpected calcification. Skull: Normal. Negative for fracture or focal lesion. Sinuses/Orbits: No acute finding. Other: None. CT CERVICAL SPINE FINDINGS Alignment: Degenerative straightening and reversal normal  cervical lordosis. Skull base and vertebrae: No acute fracture. No primary bone lesion or focal pathologic process. Soft tissues and spinal canal: No prevertebral fluid or swelling. No visible canal hematoma. Disc levels: Moderate multilevel disc degenerative disease and partial ankylosis throughout the cervical spine. Upper chest: Negative. Other: None. IMPRESSION: 1. No acute intracranial pathology. Small-vessel white matter disease. 2. No fracture or static subluxation of the cervical spine. 3. Moderate multilevel disc degenerative disease and partial ankylosis throughout the cervical spine. Electronically Signed   By: Jearld Lesch M.D.   On: 08/17/2022 14:20   CT Cervical Spine Wo Contrast  Result Date: 08/17/2022 CLINICAL DATA:  Fall 11 days ago, on blood thinner EXAM: CT HEAD WITHOUT CONTRAST CT CERVICAL SPINE WITHOUT CONTRAST TECHNIQUE: Multidetector CT imaging of the head and cervical spine was performed following the standard protocol without intravenous contrast. Multiplanar CT image reconstructions of the cervical spine were also generated. RADIATION DOSE REDUCTION: This exam was performed according to the departmental dose-optimization program which includes automated exposure control, adjustment of the mA and/or kV according to patient size and/or use of iterative reconstruction technique. COMPARISON:  None Available. FINDINGS: CT HEAD FINDINGS Brain: No evidence of acute infarction, hemorrhage, hydrocephalus, extra-axial collection or mass lesion/mass effect. Mild periventricular hypodensity. Vascular: No hyperdense vessel or unexpected calcification. Skull: Normal. Negative for fracture or focal lesion. Sinuses/Orbits: No acute finding. Other: None. CT CERVICAL SPINE FINDINGS Alignment: Degenerative straightening  and reversal normal cervical lordosis. Skull base and vertebrae: No acute fracture. No primary bone lesion or focal pathologic process. Soft tissues and spinal canal: No prevertebral  fluid or swelling. No visible canal hematoma. Disc levels: Moderate multilevel disc degenerative disease and partial ankylosis throughout the cervical spine. Upper chest: Negative. Other: None. IMPRESSION: 1. No acute intracranial pathology. Small-vessel white matter disease. 2. No fracture or static subluxation of the cervical spine. 3. Moderate multilevel disc degenerative disease and partial ankylosis throughout the cervical spine. Electronically Signed   By: Jearld Lesch M.D.   On: 08/17/2022 14:20     Data Reviewed: Relevant notes from primary care and specialist visits, past discharge summaries as available in EHR, including Care Everywhere. Prior diagnostic testing as pertinent to current admission diagnoses Updated medications and problem lists for reconciliation ED course, including vitals, labs, imaging, treatment and response to treatment Triage notes, nursing and pharmacy notes and ED provider's notes Notable results as noted in HPI Assessment and Plan: * Generalized weakness Patient presented with generalized weakness and presyncopal type symptoms with off-and-on intermittent dizziness. Patient's symptoms progressively have gotten worse.  Will admit patient for evaluation if this is associated with an NSTEMI, or dysrhythmia, or electrolytes, also UTI.  TFTs.  Consider PT consult prior to discharge.  Patient at baseline ambulates with a walker and is very independent.  NSTEMI (non-ST elevated myocardial infarction) (HCC) New  T wave inversions in inferior and lateral leads. Will continue with anticoagulation and metoprolol along with statin therapy. As needed nitroglycerin. As needed morphine.  AKI (acute kidney injury) (HCC) Lab Results  Component Value Date   CREATININE 1.11 (H) 08/17/2022   CREATININE 0.81 07/24/2021   CREATININE 0.97 02/10/2020  Mild elevation in creatinine 1.11 will monitor expect resolution with IV fluids and hydration will limit contrast  studies.   Cystitis Urine culture obtained and patient started on Rocephin.  S/P right coronary artery (RCA) stent placement Patient has a history of RCA stent. Abnormal EKG, mildly elevated TNI, will repeat on follow-up. Will continue patient on her Coumadin, Zocor, metoprolol. Cardiology consulted.  Atrial fibrillation, chronic (HCC) Patient has chronic A-fib and we have continued her on metoprolol and warfarin with pharmacy consult.  Benign essential hypertension Patient continued on metoprolol, as needed hydralazine.   DVT prophylaxis:  Coumadin   Consults:  Cardiology   Advance Care Planning:    Code Status: Full Code   Family Communication:  Daughter Marcelle Smiling at bedside.   Disposition Plan:  Back to previous home environment  Severity of Illness: The appropriate patient status for this patient is OBSERVATION. Observation status is judged to be reasonable and necessary in order to provide the required intensity of service to ensure the patient's safety. The patient's presenting symptoms, physical exam findings, and initial radiographic and laboratory data in the context of their medical condition is felt to place them at decreased risk for further clinical deterioration. Furthermore, it is anticipated that the patient will be medically stable for discharge from the hospital within 2 midnights of admission.   Author: Gertha Calkin, MD 08/18/2022 3:02 AM  For on call review [email or web address].

## 2022-08-17 NOTE — ED Triage Notes (Signed)
Here by [facility] from home for light headedness worse than usual, worsening today, reports fall 11d ago, hitting head and takes blood thinner for afib, not seen for initial fall, spoke with PCP today via phone, instructed to come to ED for light headedness and work up. Takes Coumadin. Denies LOC with initial fall. L arm bruising and skin tear noted. Ambulates with walker to ambulance.

## 2022-08-17 NOTE — ED Provider Notes (Signed)
[facility] Provider Note    Event Date/Time   First MD Initiated Contact with Patient 08/17/22 1535     (approximate)   History   Dizziness and Fall   HPI  Cynthia Simon is a 89 y.o. female here with generalized weakness and fall.  The patient states that she fell about 11 days ago.  She struck her left elbow and left flank.  She had a large hematoma to her left hip that has spread but is no longer firm or painful.  She states that over the last day or so, she separatively worsening lightheadedness with standing, generalized weakness, and fatigue.  She subsequent presents for evaluation.  No other falls.  She had some mild shortness of breath with exertion.  No headache.  No focal numbness or weakness.     Physical Exam   Triage Vital Signs: ED Triage Vitals  Encounter Vitals Group     BP 08/17/22 1308 (!) 144/112     Systolic BP Percentile --      Diastolic BP Percentile --      Pulse Rate 08/17/22 1308 (!) 54     Resp 08/17/22 1308 16     Temp 08/17/22 1308 98.3 F (36.8 C)     Temp src --      SpO2 08/17/22 1306 98 %     Weight 08/17/22 1308 115 lb (52.2 kg)     Height 08/17/22 1308 5\' 3"  (1.6 m)     Head Circumference --      Peak Flow --      Pain Score 08/17/22 1308 0     Pain Loc --      Pain Education --      Exclude from Growth Chart --     Most recent vital signs: Vitals:   08/17/22 1308 08/17/22 1600  BP: (!) 144/112 (!) 176/64  Pulse: (!) 54 60  Resp: 16 18  Temp: 98.3 F (36.8 C)   SpO2: 96% 100%     General: Awake, no distress.  CV:  Good peripheral perfusion.  Irregularly irregular rhythm. Resp:  Normal work of breathing.  Lungs clear auscultation bilaterally. Abd:  No distention.  No significant tenderness but no rebound or guarding.  No abdominal bruising. Other:  Large area of ecchymoses to the left lateral thigh, no significant skin tightness, tenderness, or bony abnormality.  Superficial ecchymoses to the  left elbow and forearm.   ED Results / Procedures / Treatments   Labs (all labs ordered are listed, but only abnormal results are displayed) Labs Reviewed  CBC - Abnormal; Notable for the following components:      Result Value   WBC 11.1 (*)    RBC 3.10 (*)    Hemoglobin 9.8 (*)    HCT 29.8 (*)    All other components within normal limits  BASIC METABOLIC PANEL - Abnormal; Notable for the following components:   Sodium 131 (*)    Glucose, Bld 116 (*)    Creatinine, Ser 1.11 (*)    GFR, Estimated 48 (*)    All other components within normal limits  PROTIME-INR - Abnormal; Notable for the following components:   Prothrombin Time 20.8 (*)    INR 1.8 (*)    All other components within normal limits  URINALYSIS, ROUTINE W REFLEX MICROSCOPIC - Abnormal; Notable for the following components:   Color, Urine YELLOW (*)    APPearance CLEAR (*)    Ketones, ur 5 (*)  Nitrite POSITIVE (*)    Leukocytes,Ua TRACE (*)    Bacteria, UA MANY (*)    All other components within normal limits  TROPONIN I (HIGH SENSITIVITY) - Abnormal; Notable for the following components:   Troponin I (High Sensitivity) 119 (*)    All other components within normal limits  URINE CULTURE     EKG Atrial fibrillation with slow ventricular response.  Ventricular rate 45, QRS 90, QTc 390.  No acute ST elevation or depression.  T wave inversions noted in the inferior and lateral precordial leads which appear largely unchanged from prior.   RADIOLOGY CT head/C-spine: No acute intracranial abnormality, degenerative changes Chest x-ray: Gross cardiomegaly with no acute abnormality, chronic loculated effusion on the right   I also independently reviewed and agree with radiologist interpretations.   PROCEDURES:  Critical Care performed: No   MEDICATIONS ORDERED IN ED: Medications  cefTRIAXone (ROCEPHIN) 2 g in sodium chloride 0.9 % 100 mL IVPB (has no administration in time range)  sodium chloride 0.9 %  bolus 500 mL (0 mLs Intravenous Stopped 08/17/22 1737)     IMPRESSION / MDM / ASSESSMENT AND PLAN / ED COURSE  I reviewed the triage vital signs and the nursing notes.                              Differential diagnosis includes, but is not limited to, generalized weakness secondary to dehydration, UTI, other occult infection, anemia, symptomatic atrial fibrillation, ACS  Patient's presentation is most consistent with acute presentation with potential threat to life or bodily function.  The patient is on the cardiac monitor to evaluate for evidence of arrhythmia and/or significant heart rate changes   89 year old female with history of A-fib on Coumadin here with generalized weakness and lightheadedness.  Suspect multifactorial weakness in the setting of recent fall with what I suspect is mild acute on chronic anemia in the setting of her large ecchymosis to the thigh on anticoagulation.  No evidence of significant bony injury here and she has no evidence to suggest expanding hematoma.  Patient also appears mildly clinically dehydrated with an elevated creatinine and a UA concerning for UTI.  She also has a troponin of 112 which I think is demand related.  She has no chest pain and EKG shows no significant change from her prior ones.  Will admit for treatment of her UTI, monitoring of her hemoglobin and further evaluation.   FINAL CLINICAL IMPRESSION(S) / ED DIAGNOSES   Final diagnoses:  Weakness  Acute cystitis without hematuria  Elevated troponin     Rx / DC Orders   ED Discharge Orders     None        Note:  This document was prepared using Dragon voice recognition software and may include unintentional dictation errors.   Shaune Pollack, MD 08/17/22 [phone]

## 2022-08-17 NOTE — Consult Note (Addendum)
ANTICOAGULATION CONSULT NOTE - Initial Consult  Pharmacy Consult for Warfarin Indication: atrial fibrillation  Allergies  Allergen Reactions   Amlodipine Besylate Swelling    In feet and ankles   Percocet [Oxycodone-Acetaminophen] Nausea And Vomiting    Patient Measurements: Height: 5\' 3"  (160 cm) Weight: 52.2 kg (115 lb) IBW/kg (Calculated) : 52.4  Vital Signs: Temp: 98.4 F (36.9 C) (07/18 1852) Temp Source: Oral (07/18 1852) BP: 144/52 (07/18 2118) Pulse Rate: 65 (07/18 2118)  Labs: Recent Labs    08/17/22 1311 08/17/22 1950  HGB 9.8*  --   HCT 29.8*  --   PLT 246  --   APTT  --  34  LABPROT 20.8*  --   INR 1.8*  --   CREATININE 1.11*  --   TROPONINIHS 119* 108*    Estimated Creatinine Clearance: 28.3 mL/min (A) (by C-G formula based on SCr of 1.11 mg/dL (H)).   Medical History: Past Medical History:  Diagnosis Date   Arthritis    Carotid artery disease (HCC)    BLOCKAGE   Coronary artery disease    Dysrhythmia    AFIB   GERD (gastroesophageal reflux disease)    HOH (hard of hearing)    Hypertension    MVP (mitral valve prolapse)    Myocardial infarction (HCC)    MILD   Palpitations    Wears hearing aid in both ears     Medications:  PTA Warfarin: 20 mg/wk 2.5 mg [id number] 5 mg Sa   Assessment: Cynthia Simon is a 89 y.o. female presenting with generalized weakness. PMH significant for HTN, AF, CAD. Patient was on Warfarin PTA per chart review. Last dose of Warfarin PTA was on 7/17 at 2200 (2.5 mg). Of note, INR was subtherapeutic on home regimen on arrival. Patient has suspected mild acute on chronic anemia in the setting of her large ecchymosis to the thigh on anticoagulation. She has no evidence to suggest expanding hematoma. Pharmacy has been consulted to initiate and manage Warfarin.    Baseline Labs: PT 20.8, INR 1.8, Hgb 9.8, Hct 29.8, Plt 246    Goal of Therapy:  INR 2-3 Monitor platelets by anticoagulation protocol: Yes   Date     INR      Warfarin Dose     Plan:  Give warfarin 2.5 mg  x1 dose today  Check INR daily until stable Check CBC at least weekly while on warfarin   Celene Squibb, PharmD Clinical Pharmacist 08/17/2022 9:36 PM

## 2022-08-18 DIAGNOSIS — R531 Weakness: Secondary | ICD-10-CM | POA: Diagnosis not present

## 2022-08-18 LAB — CBC
HCT: 29.5 % — ABNORMAL LOW (ref 36.0–46.0)
Hemoglobin: 10 g/dL — ABNORMAL LOW (ref 12.0–15.0)
MCH: 32.4 pg (ref 26.0–34.0)
MCHC: 33.9 g/dL (ref 30.0–36.0)
MCV: 95.5 fL (ref 80.0–100.0)
Platelets: 235 10*3/uL (ref 150–400)
RBC: 3.09 MIL/uL — ABNORMAL LOW (ref 3.87–5.11)
RDW: 13.5 % (ref 11.5–15.5)
WBC: 9.9 10*3/uL (ref 4.0–10.5)
nRBC: 0 % (ref 0.0–0.2)

## 2022-08-18 LAB — COMPREHENSIVE METABOLIC PANEL
ALT: 12 U/L (ref 0–44)
AST: 19 U/L (ref 15–41)
Albumin: 3.9 g/dL (ref 3.5–5.0)
Alkaline Phosphatase: 56 U/L (ref 38–126)
Anion gap: 9 (ref 5–15)
BUN: 16 mg/dL (ref 8–23)
CO2: 21 mmol/L — ABNORMAL LOW (ref 22–32)
Calcium: 8.9 mg/dL (ref 8.9–10.3)
Chloride: 101 mmol/L (ref 98–111)
Creatinine, Ser: 0.96 mg/dL (ref 0.44–1.00)
GFR, Estimated: 57 mL/min — ABNORMAL LOW (ref >60)
Glucose, Bld: 101 mg/dL — ABNORMAL HIGH (ref 70–99)
Potassium: 4 mmol/L (ref 3.5–5.1)
Sodium: 131 mmol/L — ABNORMAL LOW (ref 135–145)
Total Bilirubin: 1 mg/dL (ref 0.3–1.2)
Total Protein: 6.8 g/dL (ref 6.5–8.1)

## 2022-08-18 LAB — TROPONIN I (HIGH SENSITIVITY): Troponin I (High Sensitivity): 97 ng/L — ABNORMAL HIGH (ref <18)

## 2022-08-18 LAB — PROTIME-INR
INR: 1.7 — ABNORMAL HIGH (ref 0.8–1.2)
Prothrombin Time: 20.2 seconds — ABNORMAL HIGH (ref 11.4–15.2)

## 2022-08-18 MED ORDER — WARFARIN SODIUM 2.5 MG PO TABS
2.5000 mg | ORAL_TABLET | Freq: Once | ORAL | Status: DC
  Filled 2022-08-18: qty 1

## 2022-08-18 MED ORDER — MORPHINE SULFATE (PF) 2 MG/ML IV SOLN: 1.0000 mg | INTRAVENOUS | Status: DC | PRN

## 2022-08-18 MED ORDER — SODIUM CHLORIDE 0.9 % IV SOLN: 1.0000 g | INTRAVENOUS | Status: DC

## 2022-08-18 MED ORDER — NITROGLYCERIN 0.4 MG SL SUBL: 0.4000 mg | SUBLINGUAL_TABLET | SUBLINGUAL | Status: DC | PRN

## 2022-08-18 NOTE — Progress Notes (Signed)
PT Cancellation Note  Patient Details Name: GENOA FREYRE MRN: [id number] DOB: 05/31/1930   Cancelled Treatment:    Reason Eval/Treat Not Completed: Medical issues which prohibited therapy. PT orders received. Chart Reviewed. Noted with subtherapeutic INR on Warfarin. Consult pending. Will hold PT at this time, re attempt as medically appropriate.    Creed Copper Fairly, PT, DPT 08/18/22 10:45 AM

## 2022-08-18 NOTE — Progress Notes (Signed)
ANTICOAGULATION CONSULT NOTE - Follow Up Consult  Pharmacy Consult for Warfarin Indication: atrial fibrillation  Allergies  Allergen Reactions   Amlodipine Besylate Swelling    In feet and ankles   Percocet [Oxycodone-Acetaminophen] Nausea And Vomiting    Patient Measurements: Height: 5\' 3"  (160 cm) Weight: 52.2 kg (115 lb) IBW/kg (Calculated) : 52.4  Vital Signs: Temp: 98.1 F (36.7 C) (07/19 0958) Temp Source: Oral (07/19 0430) BP: 154/69 (07/19 0958) Pulse Rate: 58 (07/19 0958)  Labs: Recent Labs    08/17/22 1311 08/17/22 1950 08/17/22 2339 08/18/22 0528  HGB 9.8*  --   --  10.0*  HCT 29.8*  --   --  29.5*  PLT 246  --   --  235  APTT  --  34  --   --   LABPROT 20.8*  --   --  20.2*  INR 1.8*  --   --  1.7*  CREATININE 1.11*  --   --  0.96  TROPONINIHS 119* 108* 97*  --     Estimated Creatinine Clearance: 32.7 mL/min (by C-G formula based on SCr of 0.96 mg/dL).   Medications:  Scheduled:   metoprolol tartrate  25 mg Oral BID   simvastatin  10 mg Oral QHS   sodium chloride flush  3 mL Intravenous Q12H   Warfarin - Pharmacist Dosing Inpatient   Does not apply q1600   Infusions:   cefTRIAXone (ROCEPHIN)  IV      Assessment: 89 yo F presenting with generalized weakness. PMH significant for HTN, AF, CAD. Patient was on Warfarin PTA per chart review. Last dose of Warfarin PTA was on 7/17 at 2200 (2.5 mg). Of note, INR was subtherapeutic on home regimen on arrival. Patient has suspected mild acute on chronic anemia in the setting of her large ecchymosis to the thigh on anticoagulation. She has no evidence to suggest expanding hematoma. Pharmacy has been consulted for Warfarin dosing.   PTA Warfarin Regimen: 5 mg on Saturdays, 2.5 mg on all other days.   Today INR is subtherapeutic at 1.7. Hgb 10,  plt 235 - stable. No signs/symptoms of bleeding noted.   Goal of Therapy:  INR 2-3 Monitor platelets by anticoagulation protocol: Yes  Date    INR      Warfarin  Dose   7/18     1.8       2.5 mg 7/19     1.7       2.5 mg   Plan:  Give Warfarin 2.5 mg x1 dose today Check daily INR Monitor CBC and for signs/symptoms of bleeding  Netta Neat 08/18/2022,10:12 AM

## 2022-08-18 NOTE — Assessment & Plan Note (Signed)
New  T wave inversions in inferior and lateral leads. Will continue with anticoagulation and metoprolol along with statin therapy. As needed nitroglycerin. As needed morphine.

## 2022-08-18 NOTE — Assessment & Plan Note (Signed)
Patient has chronic A-fib and we have continued her on metoprolol and warfarin with pharmacy consult.

## 2022-08-18 NOTE — Assessment & Plan Note (Addendum)
Patient has a history of RCA stent. Abnormal EKG, mildly elevated TNI, will repeat on follow-up. Will continue patient on her Coumadin, Zocor, metoprolol. Cardiology consulted.

## 2022-08-18 NOTE — ED Notes (Signed)
Went over d/c paperwork at this time with patient. Pt had no questions, comments or concerns after review and verbally understood them.

## 2022-08-18 NOTE — Assessment & Plan Note (Signed)
Urine culture obtained and patient started on Rocephin.

## 2022-08-18 NOTE — Assessment & Plan Note (Signed)
Patient continued on metoprolol, as needed hydralazine.

## 2022-08-18 NOTE — Assessment & Plan Note (Signed)
Patient presented with generalized weakness and presyncopal type symptoms with off-and-on intermittent dizziness. Patient's symptoms progressively have gotten worse.  Will admit patient for evaluation if this is associated with an NSTEMI, or dysrhythmia, or electrolytes, also UTI.  TFTs.  Consider PT consult prior to discharge.  Patient at baseline ambulates with a walker and is very independent.

## 2022-08-18 NOTE — ED Notes (Signed)
Unable to complete routine labs due to the lab's system being down. Lab stated to only do emergent, essentials labs at this time.

## 2022-08-18 NOTE — Discharge Summary (Signed)
Physician Discharge Summary   Cynthia Simon  female DOB: 1930/05/12  ZOX:[id number]  PCP: Danella Penton, MD  Admit date: 08/17/2022 Discharge date: 08/18/2022  Admitted From: home Disposition:  home CODE STATUS: Full code  Discharge Instructions     Diet - low sodium heart healthy   Complete by: As directed       Hospital Course:  For full details, please see H&P, progress notes, consult notes and ancillary notes.  Briefly,  Cynthia Simon is a 89 y.o. female with medical history significant for heart disease, A-fib on Coumadin, hypertension presenting with dizziness lightheadedness.  Patient  states that she has macular degeneration and usually has blurred vision and dizziness but on the day of presentation was much worse.    * Generalized weakness Patient presented with generalized weakness and presyncopal type symptoms with off-and-on intermittent dizziness. Patient at baseline ambulates with a walker and is very independent.   Dizziness improved, pt felt ready to go home.  PT eval clear pt for discharge home.   NSTEMI, ruled out CAD S/P right coronary artery (RCA) stent placement Trop 100's and flat.  Pt denied chest pain.  Pt sees cardio Dr. Darrold Junker as outpatient.  Pt declined inpatient cardio consult. --cont ASA and statin   AKI, ruled out --does not meet criteria for AKI.  Cr 1.11 on presentation.  Recent Cr 1.     Cystitis, ruled out --Pt denied urinary symptoms.     Atrial fibrillation, chronic (HCC) --cont home metop and warfarin   Benign essential hypertension --cont home regimen as below   Discharge Diagnoses:  Principal Problem:   Generalized weakness Active Problems:   Benign essential hypertension   Atrial fibrillation, chronic (HCC)   S/P right coronary artery (RCA) stent placement   Cystitis   AKI (acute kidney injury) (HCC)   NSTEMI (non-ST elevated myocardial infarction) [facility])     Discharge Instructions:  Allergies as  of 08/18/2022       Reactions   Amlodipine Besylate Swelling   In feet and ankles   Percocet [oxycodone-acetaminophen] Nausea And Vomiting        Medication List     STOP taking these medications    meloxicam 7.5 MG tablet Commonly known as: MOBIC       TAKE these medications    Aflibercept 2 MG/0.05ML Soln by Intravitreal route every 6 (six) weeks.   aspirin EC 81 MG tablet Take 81 mg by mouth daily with lunch.   celecoxib 200 MG capsule Commonly known as: CELEBREX Take 200 mg by mouth 2 (two) times daily.   cyanocobalamin 1000 MCG/ML injection Commonly known as: VITAMIN B12 Inject 1,000 mcg into the muscle every 30 (thirty) days.   famotidine 40 MG tablet Commonly known as: PEPCID Take 1 tablet by mouth at bedtime.   furosemide 20 MG tablet Commonly known as: LASIX Take 20 mg by mouth daily.   isosorbide mononitrate 30 MG 24 hr tablet Commonly known as: IMDUR Take 30 mg by mouth daily.   losartan 25 MG tablet Commonly known as: COZAAR Take 25 mg by mouth every evening.   metoprolol tartrate 25 MG tablet Commonly known as: LOPRESSOR Take 25 mg by mouth 2 (two) times daily.   pantoprazole 40 MG tablet Commonly known as: PROTONIX Take 40 mg by mouth daily.   potassium chloride 8 MEQ tablet Commonly known as: KLOR-CON Take 8 mEq by mouth daily with lunch.   simvastatin 10 MG tablet Commonly known as:  ZOCOR Take 10 mg by mouth at bedtime.   traMADol 50 MG tablet Commonly known as: ULTRAM Take 50 mg by mouth 2 (two) times daily as needed for moderate pain.   warfarin 5 MG tablet Commonly known as: COUMADIN Take 2.5-5 mg by mouth See admin instructions. Take  tablet (2.5mg ) by mouth every Tuesday, Wednesday, Thursday, Friday, Saturday and Sunday evening and take 1 tablet (5mg ) by mouth every Monday evening (07/15/20 - 1 tablet on Wed and Sat. 1/2 tab other days)         Follow-up Information     Paraschos, Alexander, MD. Go in 1 week(s).    Specialty: Cardiology Contact information: [address] [facility] [place] [address] [phone]                 Allergies  Allergen Reactions   Amlodipine Besylate Swelling    In feet and ankles   Percocet [Oxycodone-Acetaminophen] Nausea And Vomiting     The results of significant diagnostics from this hospitalization (including imaging, microbiology, ancillary and laboratory) are listed below for reference.   Consultations:   Procedures/Studies: DG HIP UNILAT WITH PELVIS 2-3 VIEWS LEFT  Result Date: 08/17/2022 CLINICAL DATA:  Status post fall. EXAM: DG HIP (WITH OR WITHOUT PELVIS) 2-3V LEFT COMPARISON:  None Available. FINDINGS: There is a thin, ill-defined curvilinear lucent and sclerotic area seen overlying the junction of the head and neck of the proximal left femur. This is of indeterminate age. There is no evidence of dislocation. Marked severity degenerative changes are seen involving both hips in the form of joint space narrowing and acetabular sclerosis. IMPRESSION: Findings involving the proximal left femur, as described above, which may be chronic and/or degenerative in nature. Correlation with CT is recommended, as an acute fracture cannot completely be excluded. Electronically Signed   By: Aram Candela M.D.   On: 08/17/2022 18:49   DG Chest 2 View  Result Date: 08/17/2022 CLINICAL DATA:  Weakness EXAM: CHEST - 2 VIEW COMPARISON:  07/24/2019 FINDINGS: Gross cardiomegaly. Small, chronic loculated right pleural effusion. No acute airspace opacity. The visualized skeletal structures are unremarkable. IMPRESSION: Gross cardiomegaly. Small, chronic loculated right pleural effusion without acute abnormality of the lungs. Electronically Signed   By: Jearld Lesch M.D.   On: 08/17/2022 16:43   CT HEAD WO CONTRAST ( )  Result Date: 08/17/2022 CLINICAL DATA:  Fall 11 days ago, on blood thinner EXAM: CT HEAD WITHOUT CONTRAST CT  CERVICAL SPINE WITHOUT CONTRAST TECHNIQUE: Multidetector CT imaging of the head and cervical spine was performed following the standard protocol without intravenous contrast. Multiplanar CT image reconstructions of the cervical spine were also generated. RADIATION DOSE REDUCTION: This exam was performed according to the departmental dose-optimization program which includes automated exposure control, adjustment of the mA and/or kV according to patient size and/or use of iterative reconstruction technique. COMPARISON:  None Available. FINDINGS: CT HEAD FINDINGS Brain: No evidence of acute infarction, hemorrhage, hydrocephalus, extra-axial collection or mass lesion/mass effect. Mild periventricular hypodensity. Vascular: No hyperdense vessel or unexpected calcification. Skull: Normal. Negative for fracture or focal lesion. Sinuses/Orbits: No acute finding. Other: None. CT CERVICAL SPINE FINDINGS Alignment: Degenerative straightening and reversal normal cervical lordosis. Skull base and vertebrae: No acute fracture. No primary bone lesion or focal pathologic process. Soft tissues and spinal canal: No prevertebral fluid or swelling. No visible canal hematoma. Disc levels: Moderate multilevel disc degenerative disease and partial ankylosis throughout the cervical spine. Upper chest: Negative. Other: None. IMPRESSION: 1. No  acute intracranial pathology. Small-vessel white matter disease. 2. No fracture or static subluxation of the cervical spine. 3. Moderate multilevel disc degenerative disease and partial ankylosis throughout the cervical spine. Electronically Signed   By: Jearld Lesch M.D.   On: 08/17/2022 14:20   CT Cervical Spine Wo Contrast  Result Date: 08/17/2022 CLINICAL DATA:  Fall 11 days ago, on blood thinner EXAM: CT HEAD WITHOUT CONTRAST CT CERVICAL SPINE WITHOUT CONTRAST TECHNIQUE: Multidetector CT imaging of the head and cervical spine was performed following the standard protocol without intravenous  contrast. Multiplanar CT image reconstructions of the cervical spine were also generated. RADIATION DOSE REDUCTION: This exam was performed according to the departmental dose-optimization program which includes automated exposure control, adjustment of the mA and/or kV according to patient size and/or use of iterative reconstruction technique. COMPARISON:  None Available. FINDINGS: CT HEAD FINDINGS Brain: No evidence of acute infarction, hemorrhage, hydrocephalus, extra-axial collection or mass lesion/mass effect. Mild periventricular hypodensity. Vascular: No hyperdense vessel or unexpected calcification. Skull: Normal. Negative for fracture or focal lesion. Sinuses/Orbits: No acute finding. Other: None. CT CERVICAL SPINE FINDINGS Alignment: Degenerative straightening and reversal normal cervical lordosis. Skull base and vertebrae: No acute fracture. No primary bone lesion or focal pathologic process. Soft tissues and spinal canal: No prevertebral fluid or swelling. No visible canal hematoma. Disc levels: Moderate multilevel disc degenerative disease and partial ankylosis throughout the cervical spine. Upper chest: Negative. Other: None. IMPRESSION: 1. No acute intracranial pathology. Small-vessel white matter disease. 2. No fracture or static subluxation of the cervical spine. 3. Moderate multilevel disc degenerative disease and partial ankylosis throughout the cervical spine. Electronically Signed   By: Jearld Lesch M.D.   On: 08/17/2022 14:20      Labs: BNP (last 3 results) No results for input(s): "BNP" in the last 8760 hours. Basic Metabolic Panel: Recent Labs  Lab 08/17/22 1311 08/18/22 0528  NA 131* 131*  K 4.8 4.0  CL 99 101  CO2 24 21*  GLUCOSE 116* 101*  BUN 20 16  CREATININE 1.11* 0.96  CALCIUM 9.1 8.9   Liver Function Tests: Recent Labs  Lab 08/17/22 1311 08/18/22 0528  AST 19 19  ALT 12 12  ALKPHOS 56 56  BILITOT 1.1 1.0  PROT 6.8 6.8  ALBUMIN 3.9 3.9   No results for  input(s): "LIPASE", "AMYLASE" in the last 168 hours. No results for input(s): "AMMONIA" in the last 168 hours. CBC: Recent Labs  Lab 08/17/22 1311 08/18/22 0528  WBC 11.1* 9.9  HGB 9.8* 10.0*  HCT 29.8* 29.5*  MCV 96.1 95.5  PLT 246 235   Cardiac Enzymes: No results for input(s): "CKTOTAL", "CKMB", "CKMBINDEX", "TROPONINI" in the last 168 hours. BNP: Invalid input(s): "POCBNP" CBG: No results for input(s): "GLUCAP" in the last 168 hours. D-Dimer No results for input(s): "DDIMER" in the last 72 hours. Hgb A1c No results for input(s): "HGBA1C" in the last 72 hours. Lipid Profile No results for input(s): "CHOL", "HDL", "LDLCALC", "TRIG", "CHOLHDL", "LDLDIRECT" in the last 72 hours. Thyroid function studies Recent Labs    08/17/22 1950  TSH 1.071   Anemia work up No results for input(s): "VITAMINB12", "FOLATE", "FERRITIN", "TIBC", "IRON", "RETICCTPCT" in the last 72 hours. Urinalysis    Component Value Date/Time   COLORURINE YELLOW (A) 08/17/2022 1750   APPEARANCEUR CLEAR (A) 08/17/2022 1750   LABSPEC 1.013 08/17/2022 1750   PHURINE 5.0 08/17/2022 1750   GLUCOSEU NEGATIVE 08/17/2022 1750   HGBUR NEGATIVE 08/17/2022 1750   BILIRUBINUR NEGATIVE  08/17/2022 1750   KETONESUR 5 (A) 08/17/2022 1750   PROTEINUR NEGATIVE 08/17/2022 1750   NITRITE POSITIVE (A) 08/17/2022 1750   LEUKOCYTESUR TRACE (A) 08/17/2022 1750   Sepsis Labs Recent Labs  Lab 08/17/22 1311 08/18/22 0528  WBC 11.1* 9.9   Microbiology No results found for this or any previous visit (from the past 240 hour(s)).   Total time spend on discharging this patient, including the last patient exam, discussing the hospital stay, instructions for ongoing care as it relates to all pertinent caregivers, as well as preparing the medical discharge records, prescriptions, and/or referrals as applicable, is 45 minutes.    Darlin Priestly, MD  [facility] 08/18/2022, 12:21 PM

## 2022-08-18 NOTE — Assessment & Plan Note (Signed)
Lab Results  Component Value Date   CREATININE 1.11 (H) 08/17/2022   CREATININE 0.81 07/24/2021   CREATININE 0.97 02/10/2020  Mild elevation in creatinine 1.11 will monitor expect resolution with IV fluids and hydration will limit contrast studies.

## 2022-08-19 LAB — URINE CULTURE: Culture: >=100000 — AB

## 2022-08-20 LAB — URINE CULTURE

## 2023-06-06 ENCOUNTER — Other Ambulatory Visit: Payer: Self-pay | Admitting: Unknown Physician Specialty

## 2023-06-06 ENCOUNTER — Ambulatory Visit
Admission: RE | Admit: 2023-06-06 | Discharge: 2023-06-06 | Disposition: A | Discharge status: Home / Self Care | Source: Ambulatory Visit | Attending: Unknown Physician Specialty | Admitting: Unknown Physician Specialty

## 2023-06-06 DIAGNOSIS — J0140 Acute pansinusitis, unspecified: Secondary | ICD-10-CM
